# Patient Record
Sex: Female | Born: 1975 | Race: White | Hispanic: Yes | Marital: Married | Smoking: Never smoker
Health system: Southern US, Community
[De-identification: ages and names within clinical notes are randomized; demographics above are authoritative.]

## PROBLEM LIST (undated history)

## (undated) DIAGNOSIS — J309 Allergic rhinitis, unspecified: Secondary | ICD-10-CM

## (undated) DIAGNOSIS — E66811 Obesity, class 1: Secondary | ICD-10-CM

## (undated) DIAGNOSIS — J45909 Unspecified asthma, uncomplicated: Secondary | ICD-10-CM

## (undated) DIAGNOSIS — E669 Obesity, unspecified: Secondary | ICD-10-CM

## (undated) HISTORY — DX: Allergic rhinitis, unspecified: J30.9

## (undated) HISTORY — DX: Unspecified asthma, uncomplicated: J45.909

## (undated) HISTORY — DX: Obesity, unspecified: E66.9

## (undated) HISTORY — DX: Obesity, class 1: E66.811

---

## 2000-08-07 ENCOUNTER — Ambulatory Visit (HOSPITAL_COMMUNITY): Admission: RE | Admit: 2000-08-07 | Discharge: 2000-08-07 | Payer: Self-pay | Admitting: *Deleted

## 2000-12-24 ENCOUNTER — Inpatient Hospital Stay (HOSPITAL_COMMUNITY): Admission: AD | Admit: 2000-12-24 | Discharge: 2000-12-26 | Payer: Self-pay | Admitting: *Deleted

## 2000-12-27 ENCOUNTER — Encounter: Admission: RE | Admit: 2000-12-27 | Discharge: 2001-01-26 | Payer: Self-pay | Admitting: Obstetrics & Gynecology

## 2001-09-17 ENCOUNTER — Inpatient Hospital Stay (HOSPITAL_COMMUNITY): Admission: AD | Admit: 2001-09-17 | Discharge: 2001-09-17 | Payer: Self-pay | Admitting: *Deleted

## 2001-09-20 ENCOUNTER — Encounter: Admission: RE | Admit: 2001-09-20 | Discharge: 2001-09-20 | Payer: Self-pay | Admitting: Family Medicine

## 2001-09-26 ENCOUNTER — Encounter (INDEPENDENT_AMBULATORY_CARE_PROVIDER_SITE_OTHER): Payer: Self-pay | Admitting: *Deleted

## 2001-09-26 ENCOUNTER — Encounter: Admission: RE | Admit: 2001-09-26 | Discharge: 2001-09-26 | Payer: Self-pay | Admitting: Family Medicine

## 2001-11-07 ENCOUNTER — Encounter: Admission: RE | Admit: 2001-11-07 | Discharge: 2001-11-07 | Payer: Self-pay | Admitting: Family Medicine

## 2001-11-16 ENCOUNTER — Encounter: Admission: RE | Admit: 2001-11-16 | Discharge: 2001-11-16 | Payer: Self-pay | Admitting: Family Medicine

## 2001-12-10 ENCOUNTER — Encounter: Admission: RE | Admit: 2001-12-10 | Discharge: 2001-12-10 | Payer: Self-pay | Admitting: Family Medicine

## 2002-01-02 ENCOUNTER — Encounter: Admission: RE | Admit: 2002-01-02 | Discharge: 2002-01-02 | Payer: Self-pay | Admitting: Family Medicine

## 2002-01-09 ENCOUNTER — Encounter: Admission: RE | Admit: 2002-01-09 | Discharge: 2002-01-09 | Payer: Self-pay | Admitting: Family Medicine

## 2002-01-28 ENCOUNTER — Encounter: Admission: RE | Admit: 2002-01-28 | Discharge: 2002-01-28 | Payer: Self-pay | Admitting: Family Medicine

## 2002-02-04 ENCOUNTER — Encounter: Admission: RE | Admit: 2002-02-04 | Discharge: 2002-02-04 | Payer: Self-pay | Admitting: Family Medicine

## 2002-02-12 ENCOUNTER — Encounter: Admission: RE | Admit: 2002-02-12 | Discharge: 2002-02-12 | Payer: Self-pay | Admitting: Sports Medicine

## 2002-02-20 ENCOUNTER — Encounter: Admission: RE | Admit: 2002-02-20 | Discharge: 2002-02-20 | Payer: Self-pay | Admitting: Family Medicine

## 2002-02-25 ENCOUNTER — Encounter (HOSPITAL_COMMUNITY): Admission: RE | Admit: 2002-02-25 | Discharge: 2002-02-25 | Payer: Self-pay | Admitting: *Deleted

## 2002-02-26 ENCOUNTER — Inpatient Hospital Stay (HOSPITAL_COMMUNITY): Admission: AD | Admit: 2002-02-26 | Discharge: 2002-02-28 | Payer: Self-pay | Admitting: *Deleted

## 2002-04-16 ENCOUNTER — Encounter: Admission: RE | Admit: 2002-04-16 | Discharge: 2002-04-16 | Payer: Self-pay | Admitting: Sports Medicine

## 2002-04-16 ENCOUNTER — Encounter (INDEPENDENT_AMBULATORY_CARE_PROVIDER_SITE_OTHER): Payer: Self-pay | Admitting: *Deleted

## 2003-01-21 ENCOUNTER — Encounter: Admission: RE | Admit: 2003-01-21 | Discharge: 2003-01-21 | Payer: Self-pay | Admitting: Sports Medicine

## 2003-05-09 ENCOUNTER — Encounter: Admission: RE | Admit: 2003-05-09 | Discharge: 2003-05-09 | Payer: Self-pay | Admitting: Family Medicine

## 2003-05-19 ENCOUNTER — Encounter: Admission: RE | Admit: 2003-05-19 | Discharge: 2003-05-19 | Payer: Self-pay | Admitting: Family Medicine

## 2003-07-27 ENCOUNTER — Encounter (INDEPENDENT_AMBULATORY_CARE_PROVIDER_SITE_OTHER): Payer: Self-pay | Admitting: *Deleted

## 2003-08-22 ENCOUNTER — Encounter: Admission: RE | Admit: 2003-08-22 | Discharge: 2003-08-22 | Payer: Self-pay | Admitting: Family Medicine

## 2003-08-29 ENCOUNTER — Encounter: Admission: RE | Admit: 2003-08-29 | Discharge: 2003-08-29 | Payer: Self-pay | Admitting: Sports Medicine

## 2003-09-11 ENCOUNTER — Encounter: Admission: RE | Admit: 2003-09-11 | Discharge: 2003-09-11 | Payer: Self-pay | Admitting: Family Medicine

## 2003-12-12 ENCOUNTER — Emergency Department (HOSPITAL_COMMUNITY): Admission: EM | Admit: 2003-12-12 | Discharge: 2003-12-12 | Payer: Self-pay | Admitting: Family Medicine

## 2004-02-05 ENCOUNTER — Ambulatory Visit: Payer: Self-pay | Admitting: Sports Medicine

## 2004-02-23 ENCOUNTER — Ambulatory Visit: Payer: Self-pay | Admitting: Family Medicine

## 2004-03-16 ENCOUNTER — Ambulatory Visit: Payer: Self-pay

## 2004-04-13 ENCOUNTER — Ambulatory Visit: Payer: Self-pay | Admitting: Family Medicine

## 2004-05-07 ENCOUNTER — Ambulatory Visit: Payer: Self-pay | Admitting: Family Medicine

## 2004-05-12 ENCOUNTER — Ambulatory Visit: Payer: Self-pay | Admitting: Family Medicine

## 2004-08-13 ENCOUNTER — Ambulatory Visit: Payer: Self-pay

## 2004-08-25 ENCOUNTER — Ambulatory Visit (HOSPITAL_COMMUNITY): Admission: RE | Admit: 2004-08-25 | Discharge: 2004-08-25 | Payer: Self-pay | Admitting: Sports Medicine

## 2004-09-24 ENCOUNTER — Ambulatory Visit: Payer: Self-pay | Admitting: Sports Medicine

## 2004-12-09 ENCOUNTER — Ambulatory Visit: Payer: Self-pay | Admitting: Family Medicine

## 2005-01-21 ENCOUNTER — Ambulatory Visit: Payer: Self-pay

## 2005-06-14 ENCOUNTER — Ambulatory Visit: Payer: Self-pay | Admitting: Family Medicine

## 2006-04-03 ENCOUNTER — Ambulatory Visit: Payer: Self-pay | Admitting: Internal Medicine

## 2006-05-18 ENCOUNTER — Encounter (INDEPENDENT_AMBULATORY_CARE_PROVIDER_SITE_OTHER): Payer: Self-pay | Admitting: Internal Medicine

## 2006-05-18 ENCOUNTER — Encounter (INDEPENDENT_AMBULATORY_CARE_PROVIDER_SITE_OTHER): Payer: Self-pay | Admitting: Nurse Practitioner

## 2006-05-18 ENCOUNTER — Ambulatory Visit: Payer: Self-pay | Admitting: Internal Medicine

## 2006-05-18 LAB — CONVERTED CEMR LAB
Ketones, ur: NEGATIVE mg/dL
Leukocytes, UA: NEGATIVE
Pap Smear: NORMAL
Protein, ur: NEGATIVE mg/dL
Urobilinogen, UA: 0.2
pH: 5

## 2006-05-19 ENCOUNTER — Encounter (INDEPENDENT_AMBULATORY_CARE_PROVIDER_SITE_OTHER): Payer: Self-pay | Admitting: Nurse Practitioner

## 2006-05-19 LAB — CONVERTED CEMR LAB
ALT: 27 units/L
AST: 20 units/L
CO2: 19 meq/L
Chlamydia, DNA Probe: NEGATIVE
Creatinine, Ser: 0.69 mg/dL
GC Probe Amp, Genital: NEGATIVE
HCT: 40.5 %
Hemoglobin: 13.4 g/dL
LDL Cholesterol: 70 mg/dL
Platelets: 320 10*3/uL
Potassium: 3.9 meq/L
RDW: 13 %
Total Bilirubin: 0.5 mg/dL
Total CHOL/HDL Ratio: 2.5
Total Protein: 7.7 g/dL
WBC: 7.5 10*3/uL

## 2006-05-23 ENCOUNTER — Ambulatory Visit (HOSPITAL_COMMUNITY): Admission: RE | Admit: 2006-05-23 | Discharge: 2006-05-23 | Payer: Self-pay | Admitting: Internal Medicine

## 2006-05-25 DIAGNOSIS — J309 Allergic rhinitis, unspecified: Secondary | ICD-10-CM | POA: Insufficient documentation

## 2006-05-25 DIAGNOSIS — J45909 Unspecified asthma, uncomplicated: Secondary | ICD-10-CM | POA: Insufficient documentation

## 2006-05-26 ENCOUNTER — Encounter (INDEPENDENT_AMBULATORY_CARE_PROVIDER_SITE_OTHER): Payer: Self-pay | Admitting: *Deleted

## 2006-06-16 ENCOUNTER — Ambulatory Visit: Payer: Self-pay | Admitting: Internal Medicine

## 2006-12-01 ENCOUNTER — Telehealth (INDEPENDENT_AMBULATORY_CARE_PROVIDER_SITE_OTHER): Payer: Self-pay | Admitting: Internal Medicine

## 2006-12-08 ENCOUNTER — Ambulatory Visit: Payer: Self-pay | Admitting: Internal Medicine

## 2006-12-08 ENCOUNTER — Encounter (INDEPENDENT_AMBULATORY_CARE_PROVIDER_SITE_OTHER): Payer: Self-pay | Admitting: Nurse Practitioner

## 2006-12-08 DIAGNOSIS — I872 Venous insufficiency (chronic) (peripheral): Secondary | ICD-10-CM | POA: Insufficient documentation

## 2006-12-13 ENCOUNTER — Encounter (INDEPENDENT_AMBULATORY_CARE_PROVIDER_SITE_OTHER): Payer: Self-pay | Admitting: *Deleted

## 2007-01-24 ENCOUNTER — Ambulatory Visit: Payer: Self-pay | Admitting: Internal Medicine

## 2007-04-11 ENCOUNTER — Ambulatory Visit: Payer: Self-pay | Admitting: Internal Medicine

## 2007-05-08 ENCOUNTER — Encounter (INDEPENDENT_AMBULATORY_CARE_PROVIDER_SITE_OTHER): Payer: Self-pay | Admitting: Family Medicine

## 2007-05-18 ENCOUNTER — Ambulatory Visit: Payer: Self-pay | Admitting: Family Medicine

## 2007-05-18 ENCOUNTER — Encounter (INDEPENDENT_AMBULATORY_CARE_PROVIDER_SITE_OTHER): Payer: Self-pay | Admitting: Family Medicine

## 2007-05-18 LAB — CONVERTED CEMR LAB
Basophils Relative: 0 % (ref 0–1)
Hepatitis B Surface Ag: NEGATIVE
Lymphs Abs: 2.1 10*3/uL (ref 0.7–4.0)
MCV: 89.3 fL (ref 78.0–100.0)
Monocytes Absolute: 0.4 10*3/uL (ref 0.1–1.0)
Neutro Abs: 6 10*3/uL (ref 1.7–7.7)
Platelets: 320 10*3/uL (ref 150–400)
Rubella: >500 intl units/mL — ABNORMAL HIGH
WBC: 8.6 10*3/uL (ref 4.0–10.5)

## 2007-06-01 ENCOUNTER — Encounter (INDEPENDENT_AMBULATORY_CARE_PROVIDER_SITE_OTHER): Payer: Self-pay | Admitting: Family Medicine

## 2007-06-01 ENCOUNTER — Encounter: Payer: Self-pay | Admitting: Family Medicine

## 2007-06-01 ENCOUNTER — Ambulatory Visit: Payer: Self-pay | Admitting: Family Medicine

## 2007-06-01 LAB — CONVERTED CEMR LAB
Chlamydia, Swab/Urine, PCR: NEGATIVE
GC Probe Amp, Urine: NEGATIVE
Glucose, Urine, Semiquant: NEGATIVE
Whiff Test: NEGATIVE

## 2007-06-04 ENCOUNTER — Encounter (INDEPENDENT_AMBULATORY_CARE_PROVIDER_SITE_OTHER): Payer: Self-pay | Admitting: *Deleted

## 2007-06-11 ENCOUNTER — Ambulatory Visit: Payer: Self-pay | Admitting: Family Medicine

## 2007-06-11 LAB — CONVERTED CEMR LAB: GTT, 1 hr: 140 mg/dL

## 2007-06-15 ENCOUNTER — Ambulatory Visit: Payer: Self-pay | Admitting: Family Medicine

## 2007-06-15 ENCOUNTER — Encounter (INDEPENDENT_AMBULATORY_CARE_PROVIDER_SITE_OTHER): Payer: Self-pay | Admitting: Family Medicine

## 2007-06-25 ENCOUNTER — Encounter (INDEPENDENT_AMBULATORY_CARE_PROVIDER_SITE_OTHER): Payer: Self-pay | Admitting: Family Medicine

## 2007-07-20 ENCOUNTER — Ambulatory Visit: Payer: Self-pay | Admitting: Family Medicine

## 2007-08-13 ENCOUNTER — Ambulatory Visit: Payer: Self-pay | Admitting: Family Medicine

## 2007-08-13 ENCOUNTER — Encounter (INDEPENDENT_AMBULATORY_CARE_PROVIDER_SITE_OTHER): Payer: Self-pay | Admitting: Family Medicine

## 2007-08-13 LAB — CONVERTED CEMR LAB: Glucose, Urine, Semiquant: NEGATIVE

## 2007-08-14 LAB — CONVERTED CEMR LAB
Chlamydia, DNA Probe: NEGATIVE
GC Probe Amp, Genital: NEGATIVE

## 2007-08-27 ENCOUNTER — Encounter (INDEPENDENT_AMBULATORY_CARE_PROVIDER_SITE_OTHER): Payer: Self-pay | Admitting: Family Medicine

## 2007-08-27 ENCOUNTER — Ambulatory Visit: Payer: Self-pay | Admitting: Family Medicine

## 2007-08-30 ENCOUNTER — Ambulatory Visit: Payer: Self-pay | Admitting: Family Medicine

## 2007-08-30 ENCOUNTER — Encounter (INDEPENDENT_AMBULATORY_CARE_PROVIDER_SITE_OTHER): Payer: Self-pay | Admitting: Family Medicine

## 2007-08-30 LAB — CONVERTED CEMR LAB
Glucose, Urine, Semiquant: NEGATIVE
Hemoglobin: 10.8 g/dL — ABNORMAL LOW (ref 12.0–15.0)
Platelets: 359 10*3/uL (ref 150–400)
RBC: 3.82 M/uL — ABNORMAL LOW (ref 3.87–5.11)
RDW: 13.1 % (ref 11.5–15.5)

## 2007-09-13 ENCOUNTER — Ambulatory Visit: Payer: Self-pay | Admitting: Family Medicine

## 2007-09-13 ENCOUNTER — Encounter: Payer: Self-pay | Admitting: Family Medicine

## 2007-09-13 DIAGNOSIS — K219 Gastro-esophageal reflux disease without esophagitis: Secondary | ICD-10-CM | POA: Insufficient documentation

## 2007-10-03 ENCOUNTER — Ambulatory Visit: Payer: Self-pay | Admitting: Family Medicine

## 2007-10-19 ENCOUNTER — Ambulatory Visit: Payer: Self-pay | Admitting: Family Medicine

## 2007-10-19 ENCOUNTER — Encounter: Payer: Self-pay | Admitting: Family Medicine

## 2007-10-19 DIAGNOSIS — N898 Other specified noninflammatory disorders of vagina: Secondary | ICD-10-CM | POA: Insufficient documentation

## 2007-10-19 LAB — CONVERTED CEMR LAB
GC Probe Amp, Genital: NEGATIVE
Whiff Test: NEGATIVE

## 2007-10-25 ENCOUNTER — Encounter: Payer: Self-pay | Admitting: Family Medicine

## 2007-10-25 ENCOUNTER — Ambulatory Visit: Payer: Self-pay | Admitting: Family Medicine

## 2007-10-25 DIAGNOSIS — L299 Pruritus, unspecified: Secondary | ICD-10-CM | POA: Insufficient documentation

## 2007-10-25 DIAGNOSIS — Z22330 Carrier of Group B streptococcus: Secondary | ICD-10-CM

## 2007-10-25 LAB — CONVERTED CEMR LAB
Glucose, Urine, Semiquant: NEGATIVE
Protein, U semiquant: NEGATIVE

## 2007-10-26 LAB — CONVERTED CEMR LAB
ALT: 30 units/L (ref 0–35)
AST: 39 units/L — ABNORMAL HIGH (ref 0–37)
Alkaline Phosphatase: 201 units/L — ABNORMAL HIGH (ref 39–117)
Indirect Bilirubin: 0.4 mg/dL (ref 0.0–0.9)
Total Bilirubin: 0.5 mg/dL (ref 0.3–1.2)

## 2007-10-30 ENCOUNTER — Inpatient Hospital Stay (HOSPITAL_COMMUNITY): Admission: AD | Admit: 2007-10-30 | Discharge: 2007-11-01 | Payer: Self-pay | Admitting: Obstetrics & Gynecology

## 2007-10-30 ENCOUNTER — Ambulatory Visit: Payer: Self-pay | Admitting: Obstetrics & Gynecology

## 2007-11-24 ENCOUNTER — Telehealth (INDEPENDENT_AMBULATORY_CARE_PROVIDER_SITE_OTHER): Payer: Self-pay | Admitting: Family Medicine

## 2007-12-10 ENCOUNTER — Encounter (INDEPENDENT_AMBULATORY_CARE_PROVIDER_SITE_OTHER): Payer: Self-pay | Admitting: Family Medicine

## 2007-12-10 ENCOUNTER — Ambulatory Visit: Payer: Self-pay | Admitting: Family Medicine

## 2007-12-10 DIAGNOSIS — N760 Acute vaginitis: Secondary | ICD-10-CM | POA: Insufficient documentation

## 2007-12-10 DIAGNOSIS — R209 Unspecified disturbances of skin sensation: Secondary | ICD-10-CM

## 2007-12-12 LAB — CONVERTED CEMR LAB: GC Probe Amp, Genital: NEGATIVE

## 2008-01-23 ENCOUNTER — Telehealth (INDEPENDENT_AMBULATORY_CARE_PROVIDER_SITE_OTHER): Payer: Self-pay | Admitting: Internal Medicine

## 2008-02-15 ENCOUNTER — Encounter (INDEPENDENT_AMBULATORY_CARE_PROVIDER_SITE_OTHER): Payer: Self-pay | Admitting: Family Medicine

## 2008-02-15 ENCOUNTER — Ambulatory Visit: Payer: Self-pay | Admitting: Family Medicine

## 2008-02-26 ENCOUNTER — Ambulatory Visit: Payer: Self-pay | Admitting: Family Medicine

## 2008-02-26 DIAGNOSIS — D649 Anemia, unspecified: Secondary | ICD-10-CM | POA: Insufficient documentation

## 2008-02-26 HISTORY — DX: Anemia, unspecified: D64.9

## 2008-08-14 ENCOUNTER — Ambulatory Visit: Payer: Self-pay | Admitting: Internal Medicine

## 2008-08-26 LAB — CONVERTED CEMR LAB
Eosinophils Relative: 8 % — ABNORMAL HIGH (ref 0–5)
Hemoglobin: 13.8 g/dL (ref 12.0–15.0)
Monocytes Relative: 5 % (ref 3–12)
Neutro Abs: 3.6 10*3/uL (ref 1.7–7.7)
Neutrophils Relative %: 56 % (ref 43–77)
RBC: 4.48 M/uL (ref 3.87–5.11)
RDW: 13.4 % (ref 11.5–15.5)
WBC: 6.4 10*3/uL (ref 4.0–10.5)

## 2008-12-04 ENCOUNTER — Ambulatory Visit: Payer: Self-pay | Admitting: Internal Medicine

## 2008-12-04 DIAGNOSIS — R109 Unspecified abdominal pain: Secondary | ICD-10-CM

## 2008-12-04 DIAGNOSIS — L659 Nonscarring hair loss, unspecified: Secondary | ICD-10-CM | POA: Insufficient documentation

## 2008-12-04 LAB — CONVERTED CEMR LAB
Bilirubin Urine: NEGATIVE
Glucose, Urine, Semiquant: NEGATIVE

## 2008-12-05 ENCOUNTER — Encounter (INDEPENDENT_AMBULATORY_CARE_PROVIDER_SITE_OTHER): Payer: Self-pay | Admitting: Internal Medicine

## 2008-12-22 ENCOUNTER — Encounter (INDEPENDENT_AMBULATORY_CARE_PROVIDER_SITE_OTHER): Payer: Self-pay | Admitting: Internal Medicine

## 2009-01-01 ENCOUNTER — Ambulatory Visit (HOSPITAL_COMMUNITY): Admission: RE | Admit: 2009-01-01 | Discharge: 2009-01-01 | Payer: Self-pay | Admitting: Internal Medicine

## 2009-02-06 ENCOUNTER — Ambulatory Visit: Payer: Self-pay | Admitting: Internal Medicine

## 2009-02-06 DIAGNOSIS — N83209 Unspecified ovarian cyst, unspecified side: Secondary | ICD-10-CM

## 2009-03-02 ENCOUNTER — Ambulatory Visit (HOSPITAL_COMMUNITY): Admission: RE | Admit: 2009-03-02 | Discharge: 2009-03-02 | Payer: Self-pay | Admitting: Internal Medicine

## 2009-03-16 ENCOUNTER — Ambulatory Visit: Payer: Self-pay | Admitting: Internal Medicine

## 2009-04-16 ENCOUNTER — Ambulatory Visit: Payer: Self-pay | Admitting: Internal Medicine

## 2009-04-16 ENCOUNTER — Other Ambulatory Visit: Admission: RE | Admit: 2009-04-16 | Discharge: 2009-04-16 | Payer: Self-pay | Admitting: Internal Medicine

## 2009-04-16 DIAGNOSIS — L301 Dyshidrosis [pompholyx]: Secondary | ICD-10-CM

## 2009-04-16 DIAGNOSIS — Z8639 Personal history of other endocrine, nutritional and metabolic disease: Secondary | ICD-10-CM

## 2009-04-16 DIAGNOSIS — Z862 Personal history of diseases of the blood and blood-forming organs and certain disorders involving the immune mechanism: Secondary | ICD-10-CM

## 2009-04-16 HISTORY — DX: Personal history of diseases of the blood and blood-forming organs and certain disorders involving the immune mechanism: Z86.2

## 2009-04-16 HISTORY — DX: Personal history of diseases of the blood and blood-forming organs and certain disorders involving the immune mechanism: Z86.39

## 2009-04-16 LAB — CONVERTED CEMR LAB
Bilirubin Urine: NEGATIVE
Glucose, Urine, Semiquant: NEGATIVE
KOH Prep: NEGATIVE
Ketones, urine, test strip: NEGATIVE
Nitrite: NEGATIVE
Pap Smear: NEGATIVE
Protein, U semiquant: NEGATIVE
Specific Gravity, Urine: <1.005
Urobilinogen, UA: 0.2
WBC Urine, dipstick: NEGATIVE
Whiff Test: NEGATIVE
pH: 5

## 2009-04-30 DIAGNOSIS — R7309 Other abnormal glucose: Secondary | ICD-10-CM

## 2009-04-30 LAB — CONVERTED CEMR LAB
Albumin: 5.1 g/dL (ref 3.5–5.2)
CO2: 24 meq/L (ref 19–32)
Chlamydia, DNA Probe: NEGATIVE
Glucose, Bld: 104 mg/dL — ABNORMAL HIGH (ref 70–99)
Potassium: 4.4 meq/L (ref 3.5–5.3)
Sodium: 139 meq/L (ref 135–145)
Total Bilirubin: 0.3 mg/dL (ref 0.3–1.2)
Total Protein: 7.9 g/dL (ref 6.0–8.3)

## 2009-05-04 ENCOUNTER — Ambulatory Visit: Payer: Self-pay | Admitting: Internal Medicine

## 2009-05-04 LAB — CONVERTED CEMR LAB: Hgb A1c MFr Bld: 4.7 %

## 2009-05-12 ENCOUNTER — Emergency Department (HOSPITAL_COMMUNITY): Admission: EM | Admit: 2009-05-12 | Discharge: 2009-05-12 | Payer: Self-pay | Admitting: Emergency Medicine

## 2009-06-01 ENCOUNTER — Encounter (INDEPENDENT_AMBULATORY_CARE_PROVIDER_SITE_OTHER): Payer: Self-pay | Admitting: *Deleted

## 2010-01-07 ENCOUNTER — Ambulatory Visit: Payer: Self-pay | Admitting: Nurse Practitioner

## 2010-01-07 ENCOUNTER — Encounter (INDEPENDENT_AMBULATORY_CARE_PROVIDER_SITE_OTHER): Payer: Self-pay | Admitting: Internal Medicine

## 2010-01-07 DIAGNOSIS — K089 Disorder of teeth and supporting structures, unspecified: Secondary | ICD-10-CM | POA: Insufficient documentation

## 2010-01-19 ENCOUNTER — Encounter (INDEPENDENT_AMBULATORY_CARE_PROVIDER_SITE_OTHER): Payer: Self-pay | Admitting: Internal Medicine

## 2010-04-19 ENCOUNTER — Encounter: Payer: Self-pay | Admitting: Internal Medicine

## 2010-04-27 NOTE — Assessment & Plan Note (Signed)
Summary: referral to the dentist////mc   Vital Signs:  Patient profile:   35 year old female Menstrual status:  irregular Weight:      146.2 pounds Pulse rate:   76 / minute Resp:     18 per minute BP sitting:   124 / 86  (left arm) Cuff size:   regular  Vitals Entered By: Michelle Nasuti (January 07, 2010 3:19 PM) CC: ptrequest dental referral Pain Assessment Patient in pain? yes      Intensity: 2   Primary Care Provider:  Alanda Amass MD  CC:  ptrequest dental referral.  History of Present Illness: Pt. had crown placed on tooth when she was 35 years old.  The crown has fallen off and she needs it replaced.  Allergies: No Known Drug Allergies  Physical Exam  Mouth:  Very large posterior cavity --right maxillary molar.  No surrounding erythema or fluctuance.   Impression & Recommendations:  Problem # 1:  DENTAL PAIN (ICD-525.9)  Appears pt. has lost a very large filling. Will try to refer to dental clinic, but gave her info on the free dental clinic  Orders: Dental Referral (Dentist)  Problem # 2:  Preventive Health Care (ICD-V70.0) flu vaccine.  Complete Medication List: 1)  Advair Diskus 250-50 Mcg/dose Misc (Fluticasone-salmeterol) .Marland Kitchen.. 1 inhalation two times a day 2)  Proventil Hfa 108 (90 Base) Mcg/act Aers (Albuterol sulfate) .... 2 puffs every 4 hours as needed for shortness of breath 3)  Calna Tabs (Prenatal vitamins) .... Take one tablet by mouth daily. 4)  Fluticasone Propionate 50 Mcg/act Susp (Fluticasone propionate) .... 2 sprays each nostril daily 5)  Fexofenadine Hcl 180 Mg Tabs (Fexofenadine hcl) .Marland Kitchen.. 1 tab by mouth daily 6)  Clobetasol Propionate 0.05 % Crea (Clobetasol propionate) .... Apply to hands and affected areas two times a day prn  Other Orders: Influenza Vaccine NON MCR ([address])  Patient Instructions: 1)  Dental clinic referral.   Influenza Vaccine    Vaccine Type: Fluvax Non-MCR    Site: right deltoid    Mfr:  GlaxoSmithKline    Dose: 0.5 ml    Route: IM    Given by: Michelle Nasuti    Exp. Date: 09/25/2010    Lot #: [id number]    VIS given: 10/20/09 version given January 07, 2010.  Flu Vaccine Consent Questions    Do you have a history of severe allergic reactions to this vaccine? no    Any prior history of allergic reactions to egg and/or gelatin? no    Do you have a sensitivity to the preservative Thimersol? no    Do you have a past history of Guillan-Barre Syndrome? no    Do you currently have an acute febrile illness? no    Have you ever had a severe reaction to latex? no    Vaccine information given and explained to patient? yes    Are you currently pregnant? no

## 2010-04-27 NOTE — Letter (Signed)
Summary: TEST ORDER FORM/ULTRASOUND//[facility]  TEST ORDER FORM/ULTRASOUND//[facility]   Imported By: Arta Bruce 04/17/2009 11:02:11  _____________________________________________________________________  External Attachment:    Type:   Image     Comment:   External Document

## 2010-04-27 NOTE — Letter (Signed)
Summary: DENTAL REFERRAL  DENTAL REFERRAL   Imported By: Arta Bruce 01/21/2010 15:50:02  _____________________________________________________________________  External Attachment:    Type:   Image     Comment:   External Document

## 2010-04-27 NOTE — Letter (Signed)
Summary: Generic Letter  [facility]  [address] [place], [place] [address]   Phone: [phone]  Fax: [phone]    01/07/2010     RE:  DULCEMARIA BULA      [address]      [place], [place]  [address]  To Whom It May Concern:  Ms. Domenick Bookbinder is a patient at our clinic who desperately needs some dental attention.  There is a free Clinic on Friday, November 11 and Saturday, November 12.  As her health care provider, I am asking that she be allowed to go to the clinic on one of these days to receive the care she needs.  Please do not hesitate to call if you need more information.  Thanks so much for your attention to this matter.        Sincerely,   Julieanne Manson MD

## 2010-04-27 NOTE — Letter (Signed)
Summary: *HSN Results Follow up  [facility]  [address] [place], [place] [address]   Phone: [phone]  Fax: [phone]      06/01/2009   [facility] CARMEN MOSQUEDA [address] [place], [place]  [address]   Dear  Ms. Andromeda MOSQUEDA,                            ____S.Drinkard,FNP   ____D. Gore,FNP       ____B. McPherson,MD   ____V. Rankins,MD    __xx__E. Mulberry,MD    ____N. Daphine Deutscher, FNP  ____D. Reche Dixon, MD    ____K. Philipp Deputy, MD    ____Other     This letter is to inform you that your recent test(s):  _______Pap Smear    ___xx____Lab Test     _______X-ray    ___xx____ is within acceptable limits  _______ requires a medication change  _______ requires a follow-up lab visit  _______ requires a follow-up visit with your provider   Comments:  Su nivel de azucar fue normal, usted no tiene diabetes.       _________________________________________________________ If you have any questions, please contact our office                     Sincerely,  Vesta Mixer CMA [facility]

## 2010-04-27 NOTE — Assessment & Plan Note (Signed)
Summary: CPP EXAM///GK   Vital Signs:  Patient profile:   35 year old female Menstrual status:  irregular LMP:     03/11/2009 Height:      60 inches Weight:      140.9 pounds BMI:     27.62 Temp:     97.8 degrees F oral Pulse rate:   80 / minute Pulse rhythm:   regular Resp:     17 per minute BP sitting:   113 / 70  (left arm) Cuff size:   regular  Vitals Entered By: Geanie Cooley  (April 16, 2009 3:48 PM) CC:  pt here for CPP and wants to know results of ultrasound. Pt states she feels like there is a ballon inside her vagina, its a big bump she feels in the inside. Pt states she has had the problem since her daughter was born ago but it feels it has dropped lower in her vagina. Pt states she has a lot of abdominal pain, and trouble with her ovaries. Pt states that when she sits for long periods of times, thats when she has the most pain in the ovaries Pain Assessment Patient in pain? yes     Location: abdomen Intensity: 5 Type: aching  Does patient need assistance? Functional Status Self care Ambulation Normal LMP (date): 03/11/2009 LMP - Character: normal    Menses interval (days): 28 Menstrual flow (days): 3 On BCP's at conception: no Enter LMP: 03/11/2009 Last PAP Result Normal   Primary Care Provider:  Alanda Amass MD  CC:   pt here for CPP and wants to know results of ultrasound. Pt states she feels like there is a ballon inside her vagina, its a big bump she feels in the inside. Pt states she has had the problem since her daughter was born ago but it feels it has dropped lower in her vagina. Pt states she has a lot of abdominal pain, and trouble with her ovaries. Pt states that when she sits for long periods of times, and thats when she has the most pain in the ovaries.  History of Present Illness: 35 yo female here for CPP.  Concerns:  1.  Pelvic discomfort:  as above.  Discussed, however, that the previous right ovarian cyst is gone.  IUD  in in place.  Pt. feels like there is something in her vaginal canal that shouldn't be there.  Notes with this that her urine flow is decreased.  No urinary incontinence.  Again, noted this first after last child born.  Habits & Providers  Alcohol-Tobacco-Diet     Alcohol drinks/day: 0     Tobacco Status: never  Exercise-Depression-Behavior     Drug Use: never  Allergies (verified): No Known Drug Allergies  Past History:  Past Medical History: Reviewed history from 06/01/2007 and no changes required. RHINITIS, ALLERGIC (ICD-477.9) ASTHMA, UNSPECIFIED (ICD-493.90) Peak Flow-400-420 baseline - 02/23/2004  Past Surgical History: Reviewed history from 06/01/2007 and no changes required. None  Family History: Mother, 56:  DM, hypertension.  Lives in [place] Father, 32:  Hypertension 3 Siblings: Sister with hypertension. 4 children:  12, 8, 7, 18 mos:  all healthy  Social History: Married and lives at home  with her husband and 4 children. 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 Eyes:  Denies blurring. ENT:  Denies decreased hearing. CV:  Denies chest pain or discomfort and palpitations. Resp:  Asthma fairly well  controlled Uses rescue inhaler maybe every 2 weeks.Marland Kitchen GI:  Complains of abdominal pain; denies bloody stools, dark tarry stools, and diarrhea; See HPI Does get BRBPR--only on tissue and when constipated--infrequent.. GU:  Denies discharge; see HPE with urination.. MS:  Denies joint pain, joint redness, and joint swelling. Derm:  Denies rash; red mark in glabellar area--comes and goes.  Currently has had for 2 weeks and has not resolved.  Starts out with dryness, thicker skin there.  . Neuro:  Denies numbness, tingling, and weakness. Psych:  Denies anxiety, depression, and suicidal thoughts/plans.  Physical Exam  General:  overweight, NAD Head:  Normocephalic and atraumatic without obvious  abnormalities. No apparent alopecia or balding. Eyes:  No corneal or conjunctival inflammation noted. EOMI. Perrla. Funduscopic exam benign, without hemorrhages, exudates or papilledema. Vision grossly normal. Ears:  External ear exam shows no significant lesions or deformities.  Otoscopic examination reveals clear canals, tympanic membranes are intact bilaterally without bulging, retraction, inflammation or discharge. Hearing is grossly normal bilaterally. Nose:  External nasal examination shows no deformity or inflammation. Nasal mucosa are pink and moist without lesions or exudates. Mouth:  Oral mucosa and oropharynx without lesions or exudates.  Teeth in good repair. Neck:  No deformities, masses, or tenderness noted. Breasts:  No mass, nodules, thickening, tenderness, bulging, retraction, inflamation, nipple discharge or skin changes noted.   Lungs:  Normal respiratory effort, chest expands symmetrically. Lungs are clear to auscultation, no crackles or wheezes. Heart:  Normal rate and regular rhythm. S1 and S2 normal without gallop, murmur, click, rub or other extra sounds. Abdomen:  Bowel sounds positive,abdomen soft and non-tender without masses, organomegaly or hernias noted. Genitalia:  Pelvic Exam:        External: normal female genitalia without lesions or masses        Vagina: normal without lesions or masses        Cervix: normal without lesions or masses--a bit of inflammation about IUD strings exiting os.  Thin white discharge,        Adnexa: normal bimanual exam without masses or fullness        Uterus: normal by palpation        Pap smear: performed Msk:  No deformity or scoliosis noted of thoracic or lumbar spine.   Pulses:  R and L carotid,radial,femoral,dorsalis pedis and posterior tibial pulses are full and equal bilaterally Extremities:  No clubbing, cyanosis, edema, or deformity noted with normal full range of motion of all joints.   Neurologic:  No cranial nerve deficits  noted. Station and gait are normal. Plantar reflexes are down-going bilaterally. DTRs are symmetrical throughout. Sensory, motor and coordinative functions appear intact. Skin:  Flushed skin on cheeks. 1/2 cm reddened nonpalpable area in glabellar area Dry papules on flushed background on upper arms Hands chapped from MCPs to fingertips with  Cervical Nodes:  No lymphadenopathy noted Axillary Nodes:  No palpable lymphadenopathy Inguinal Nodes:  No significant adenopathy Psych:  Cognition and judgment appear intact. Alert and cooperative with normal attention span and concentration. No apparent delusions, illusions, hallucinations   Impression & Recommendations:  Problem # 1:  ROUTINE GYNECOLOGICAL EXAMINATION (ICD-V72.31)  Orders: Pap Smear, Thin Prep ( Collection of) (Z6109) KOH/ WET Mount [phone]) UA Dipstick w/o Micro (manual) ([address]) T- GC Chlamydia ([address]) T-HIV Antibody  (Reflex) ([id number]) T-Syphilis Test (RPR) ([address]-96[address])  Problem # 2:  DYSHIDROSIS (ICD-705.81) Clobetasol to severe areas two times a day prn  Complete Medication List: 1)  Advair Diskus 250-50 Mcg/dose Misc (  Fluticasone-salmeterol) .Marland Kitchen.. 1 inhalation two times a day 2)  Proventil Hfa 108 (90 Base) Mcg/act Aers (Albuterol sulfate) .... 2 puffs every 4 hours as needed for shortness of breath 3)  Calna Tabs (Prenatal vitamins) .... Take one tablet by mouth daily. 4)  Fluticasone Propionate 50 Mcg/act Susp (Fluticasone propionate) .... 2 sprays each nostril daily 5)  Fexofenadine Hcl 180 Mg Tabs (Fexofenadine hcl) .Marland Kitchen.. 1 tab by mouth daily 6)  Clobetasol Propionate 0.05 % Crea (Clobetasol propionate) .... Apply to hands and affected areas two times a day prn  Other Orders: T-Comprehensive Metabolic Panel ([id number])  Patient Instructions: 1)  calcium 500 mg with 200 International Units vitamin D two times a day  2)  CPP with Dr. Delrae Alfred in 1 year 3)  Follow up with Dr. Delrae Alfred in 6 months  --asthma Prescriptions: CLOBETASOL PROPIONATE 0.05 % CREA (CLOBETASOL PROPIONATE) apply to hands and affected areas two times a day prn  #60 g x 0   Entered and Authorized by:   Julieanne Manson MD   Signed by:   Julieanne Manson MD on 04/16/2009   Method used:   Faxed to ...       [facility] (retail)       [address] [place], [place]  [address]       Ph: [phone]       Fax: [phone]   RxID:   [id number] FEXOFENADINE HCL 180 MG TABS (FEXOFENADINE HCL) 1 tab by mouth daily  #30 x 11   Entered and Authorized by:   Julieanne Manson MD   Signed by:   Julieanne Manson MD on 04/16/2009   Method used:   Faxed to ...       [facility] - [facility] (retail)       [address] [place], [place]  [address]       Ph: [phone]       Fax: [phone]   RxID:   [id number] FLUTICASONE PROPIONATE 50 MCG/ACT SUSP (FLUTICASONE PROPIONATE) 2 sprays each nostril daily  #30 x 11   Entered and Authorized by:   Julieanne Manson MD   Signed by:   Julieanne Manson MD on 04/16/2009   Method used:   Faxed to ...       [facility] - [facility] (retail)       [address] [place], [place]  [address]       Ph: [phone]       Fax: [phone]   RxID:   [id number] PROVENTIL HFA 108 (90 BASE) MCG/ACT AERS (ALBUTEROL SULFATE) 2 puffs every 4 hours as needed for shortness of breath  #1 x 3   Entered and Authorized by:   Julieanne Manson MD   Signed by:   Julieanne Manson MD on 04/16/2009   Method used:   Faxed to ...       [facility] - [facility] (retail)       [address] [place], [place]  [address]       Ph: [phone]       Fax: [phone]   RxID:   [id number] ADVAIR DISKUS 250-50 MCG/DOSE  MISC (FLUTICASONE-SALMETEROL) 1 inhalation two times a day  #1 x 11   Entered and Authorized by:   Julieanne Manson MD   Signed by:  Julieanne Manson MD on 04/16/2009   Method used:   Faxed to ...       [facility] - [facility] (retail)       [address] [place], [place]  [address]       Ph: [phone]       Fax: [phone]   RxID:   [id number]    Vital Signs:  Patient profile:   35 year old female Menstrual status:  irregular LMP:     03/11/2009 Height:      60 inches Weight:      140.9 pounds BMI:     27.62 Temp:     97.8 degrees F oral Pulse rate:   80 / minute Pulse rhythm:   regular Resp:     17 per minute BP sitting:   113 / 70  (left arm) Cuff size:   regular  Vitals Entered By: Geanie Cooley  (April 16, 2009 3:48 PM)    Preventive Care Screening  Prior Values:    Pap Smear:  Normal (05/18/2006)    Last Tetanus Booster:  Historical (03/29/2006)    Last Flu Shot:  Fluvax 3+ (03/16/2009)    Last Pneumovax:  Done. (02/26/2004)     No history of Mammogram. SBE:  No changes--checks monthly Osteoprevention:  One serving of dairy daily.  Does not tolerate milk well--GI upset.  No real exercise.  Laboratory Results   Urine Tests  Date/Time Received: April 16, 2009 4:16 PM   Routine Urinalysis   Color: lt. yellow Appearance: Clear Glucose: negative   (Normal Range: Negative) Bilirubin: negative   (Normal Range: Negative) Ketone: negative   (Normal Range: Negative) Spec. Gravity: <1.005   (Normal Range: 1.003-1.035) Blood: trace-lysed   (Normal Range: Negative) pH: 5.0   (Normal Range: 5.0-8.0) Protein: negative   (Normal Range: Negative) Urobilinogen: 0.2   (Normal Range: 0-1) Nitrite: negative   (Normal Range: Negative) Leukocyte Esterace: negative   (Normal Range: Negative)      Wet Mount Source: vaginal WBC/hpf: 5-10 Bacteria/hpf: 1+ Clue cells/hpf: none  Negative whiff Yeast/hpf: none Wet Mount KOH: Negative Trichomonas/hpf: none    Laboratory Results   Urine Tests    Routine  Urinalysis   Color: lt. yellow Appearance: Clear Glucose: negative   (Normal Range: Negative) Bilirubin: negative   (Normal Range: Negative) Ketone: negative   (Normal Range: Negative) Spec. Gravity: <1.005   (Normal Range: 1.003-1.035) Blood: trace-lysed   (Normal Range: Negative) pH: 5.0   (Normal Range: 5.0-8.0) Protein: negative   (Normal Range: Negative) Urobilinogen: 0.2   (Normal Range: 0-1) Nitrite: negative   (Normal Range: Negative) Leukocyte Esterace: negative   (Normal Range: Negative)      Wet Mount/KOH  Negative whiff   Appended Document: CPP EXAM///GK  Laboratory Results  Date/Time Received: April 16, 2009 5:42 PM   Other Tests  Rapid HIV: negative

## 2010-09-14 ENCOUNTER — Other Ambulatory Visit (HOSPITAL_COMMUNITY): Payer: Self-pay | Admitting: Family Medicine

## 2010-09-14 ENCOUNTER — Other Ambulatory Visit: Payer: Self-pay | Admitting: Family Medicine

## 2010-09-14 DIAGNOSIS — R1032 Left lower quadrant pain: Secondary | ICD-10-CM

## 2010-09-14 DIAGNOSIS — N912 Amenorrhea, unspecified: Secondary | ICD-10-CM

## 2010-09-16 ENCOUNTER — Other Ambulatory Visit (HOSPITAL_COMMUNITY): Payer: Self-pay

## 2010-12-24 LAB — CBC
HCT: 36.2
HCT: 38.5
Hemoglobin: 12
Hemoglobin: 12.7
MCHC: 33.1
MCV: 87.5
MCV: 88.4
RBC: 4.09
RDW: 19.6 — ABNORMAL HIGH
WBC: 11.2 — ABNORMAL HIGH
WBC: 9.2

## 2011-12-05 ENCOUNTER — Ambulatory Visit (INDEPENDENT_AMBULATORY_CARE_PROVIDER_SITE_OTHER): Payer: Self-pay | Admitting: Family Medicine

## 2011-12-05 ENCOUNTER — Encounter: Payer: Self-pay | Admitting: Family Medicine

## 2011-12-05 VITALS — BP 124/78 | HR 84 | Temp 98.1°F | Ht 61.0 in | Wt 152.0 lb

## 2011-12-05 DIAGNOSIS — Z3009 Encounter for other general counseling and advice on contraception: Secondary | ICD-10-CM

## 2011-12-05 DIAGNOSIS — E663 Overweight: Secondary | ICD-10-CM

## 2011-12-05 DIAGNOSIS — J45909 Unspecified asthma, uncomplicated: Secondary | ICD-10-CM

## 2011-12-05 DIAGNOSIS — J309 Allergic rhinitis, unspecified: Secondary | ICD-10-CM | POA: Insufficient documentation

## 2011-12-05 DIAGNOSIS — J4540 Moderate persistent asthma, uncomplicated: Secondary | ICD-10-CM

## 2011-12-05 DIAGNOSIS — Z6825 Body mass index (BMI) 25.0-25.9, adult: Secondary | ICD-10-CM

## 2011-12-05 HISTORY — DX: Encounter for other general counseling and advice on contraception: Z30.09

## 2011-12-05 MED ORDER — FLUTICASONE-SALMETEROL 250-50 MCG/DOSE IN AEPB: 1.0000 | INHALATION_SPRAY | Freq: Two times a day (BID) | RESPIRATORY_TRACT | Status: DC

## 2011-12-05 MED ORDER — ALBUTEROL SULFATE HFA 108 (90 BASE) MCG/ACT IN AERS: 2.0000 | INHALATION_SPRAY | Freq: Four times a day (QID) | RESPIRATORY_TRACT | Status: DC | PRN

## 2011-12-05 MED ORDER — NORGESTIMATE-ETH ESTRADIOL 0.25-35 MG-MCG PO TABS: 1.0000 | ORAL_TABLET | Freq: Every day | ORAL | Status: DC

## 2011-12-05 NOTE — Assessment & Plan Note (Signed)
Well controlled, no wheezing on exam. Once weekly symptoms. Have refilled advair and albuterol.

## 2011-12-05 NOTE — Progress Notes (Signed)
Subjective:    Patient ID: Amber Nixon, female    DOB: 1975/08/06, 36 y.o.   MRN: [id number]  HPI New Patient appointment.   1. Birth control-patient only interested in OCP. HIstory of IUD with extreme cramping that patient endured for 3 years. LMP 12/03/11 and uses condoms regularly.   2. Asthma, well controlled-patient on adviar and albuterol. USes albuterol once a week or less. No nighttime awakenings.   3. Overweight-patient admits to having a hard time fixing healthy foods as family not very supportive. She is not currently exercising at all.   THe following were reviewed and updated in epic: Past Medical History  Diagnosis Date  . Allergic rhinitis   . Asthma     1998   Family History  Problem Relation Age of Onset  . Asthma      aunts/uncles  . Diabetes      mother  . Hyperlipidemia Father     father  . Hypertension Father     mother   Meds- Current Outpatient Prescriptions on File Prior to Visit  Medication Sig Dispense Refill  . albuterol (PROVENTIL HFA;VENTOLIN HFA) 108 (90 BASE) MCG/ACT inhaler Inhale 2 puffs into the lungs every 6 (six) hours as needed.      . Fluticasone-Salmeterol (ADVAIR) 250-50 MCG/DOSE AEPB Inhale 1 puff into the lungs every 12 (twelve) hours.      . norgestimate-ethinyl estradiol (ORTHO-CYCLEN,SPRINTEC,PREVIFEM) 0.25-35 MG-MCG tablet Take 1 tablet by mouth daily.       Allergies-none  History   Social History  . Marital Status: Married   Social History Main Topics  . Smoking status: Never Smoker   . Smokeless tobacco: Not on file  . Alcohol Use: No  . Drug Use: No  . Sexually Active: Yes    Birth Control/ Protection: Condom, Pill    Social History Narrative   Livew with Husband Amber Nixon who is establishing care here as well as 4 kids. Patient has a middle school education and speaks some english but requires Spanish interpreter. WOrks part time at [employer].     Review of Systems -See HPI  Past Medical  History-smoking status noted: never smoker. Reviewed problem list.  Medications- reviewed and updated Chief complaint-noted    Objective:   Physical Exam  Constitutional: She is oriented to person, place, and time. She appears well-developed and well-nourished. No distress.  HENT:  Head: Normocephalic and atraumatic.  Right Ear: External ear normal.  Left Ear: External ear normal.  Mouth/Throat: Oropharynx is clear and moist.  Eyes: Conjunctivae and EOM are normal. Pupils are equal, round, and reactive to light.  Neck: Normal range of motion. Neck supple. No thyromegaly present.  Cardiovascular: Normal rate and regular rhythm.  Exam reveals no gallop and no friction rub.   No murmur heard. Pulmonary/Chest: Effort normal and breath sounds normal. She has no wheezes. She has no rales.  Abdominal: Soft. Bowel sounds are normal. She exhibits no distension. There is no tenderness.  Musculoskeletal: Normal range of motion. She exhibits no edema.  Lymphadenopathy:    She has no cervical adenopathy.  Neurological: She is alert and oriented to person, place, and time.  Skin: Skin is warm and dry. She is not diaphoretic.  Psychiatric: She has a normal mood and affect. Her behavior is normal.  BP 124/78  Pulse 84  Temp 98.1 F (36.7 C) (Oral)  Ht 5\' 1"  (1.549 m)  Wt 152 lb (68.947 kg)  BMI 28.72 kg/m2  Assessment & Plan:

## 2011-12-05 NOTE — Assessment & Plan Note (Signed)
Counseled on exercise and increased fruits and vegetables. Plan to discuss with husband when he establishes care as well.

## 2011-12-05 NOTE — Patient Instructions (Signed)
I would like to see you at least every year. You set a goal of exercising twice a week by walking around your neighborhood.. I am going to refill your medicines.

## 2011-12-05 NOTE — Assessment & Plan Note (Signed)
Refilled SPrintec. LMP 12/03/11 and has been using condoms. Will defer pregnancy testing given recent LMP>

## 2012-01-19 ENCOUNTER — Ambulatory Visit (INDEPENDENT_AMBULATORY_CARE_PROVIDER_SITE_OTHER): Payer: Self-pay | Admitting: *Deleted

## 2012-01-19 DIAGNOSIS — Z23 Encounter for immunization: Secondary | ICD-10-CM

## 2012-12-26 ENCOUNTER — Telehealth: Payer: Self-pay | Admitting: Family Medicine

## 2012-12-26 DIAGNOSIS — Z3009 Encounter for other general counseling and advice on contraception: Secondary | ICD-10-CM

## 2012-12-26 MED ORDER — NORGESTIMATE-ETH ESTRADIOL 0.25-35 MG-MCG PO TABS: 1.0000 | ORAL_TABLET | Freq: Every day | ORAL | Status: DC

## 2012-12-26 NOTE — Telephone Encounter (Signed)
Refilled birth control

## 2012-12-31 ENCOUNTER — Telehealth: Payer: Self-pay | Admitting: Family Medicine

## 2012-12-31 DIAGNOSIS — Z3009 Encounter for other general counseling and advice on contraception: Secondary | ICD-10-CM

## 2012-12-31 NOTE — Telephone Encounter (Signed)
Will forward to MD. Chanteria Haggard,CMA  

## 2012-12-31 NOTE — Telephone Encounter (Signed)
Husband called for wife-she doesn't speak English Would like refill on birth control Pharmacy: [facility]

## 2013-01-01 MED ORDER — NORGESTIMATE-ETH ESTRADIOL 0.25-35 MG-MCG PO TABS: 1.0000 | ORAL_TABLET | Freq: Every day | ORAL | Status: DC

## 2013-01-01 NOTE — Telephone Encounter (Signed)
Refilled.  Please inform patient.

## 2019-05-27 ENCOUNTER — Other Ambulatory Visit: Payer: Self-pay

## 2019-05-27 DIAGNOSIS — Z1231 Encounter for screening mammogram for malignant neoplasm of breast: Secondary | ICD-10-CM

## 2019-06-25 ENCOUNTER — Other Ambulatory Visit: Payer: Self-pay

## 2019-06-25 ENCOUNTER — Ambulatory Visit
Admission: RE | Admit: 2019-06-25 | Discharge: 2019-06-25 | Disposition: A | Discharge status: Home / Self Care | Payer: No Typology Code available for payment source | Source: Ambulatory Visit | Attending: Obstetrics and Gynecology | Admitting: Obstetrics and Gynecology

## 2019-06-25 ENCOUNTER — Encounter: Payer: Self-pay | Admitting: Advanced Practice Midwife

## 2019-06-25 ENCOUNTER — Ambulatory Visit: Payer: Self-pay | Admitting: Advanced Practice Midwife

## 2019-06-25 VITALS — BP 128/84 | Temp 97.1°F | Wt 172.0 lb

## 2019-06-25 DIAGNOSIS — Z1239 Encounter for other screening for malignant neoplasm of breast: Secondary | ICD-10-CM

## 2019-06-25 DIAGNOSIS — Z1231 Encounter for screening mammogram for malignant neoplasm of breast: Secondary | ICD-10-CM

## 2019-06-25 NOTE — Progress Notes (Signed)
[name] is a 44 y.o. female who presents to [facility] clinic today with complaint of breast lump in right armpit. She has had for almost 17 years since she had her last child.     Pap Smear: Pap not smear completed today. Last Pap smear was 2019 at [facility] clinic and was normal. Per patient has no history of an abnormal Pap smear. Last Pap smear result is not available in Epic.   Physical exam: Breasts Breasts symmetrical. No skin abnormalities bilateral breasts. No nipple retraction bilateral breasts. No nipple discharge bilateral breasts. No lymphadenopathy.  5cmx5cm enlarged area in the right axilla. Patient states that this has been the same for 17 years. No changes, no pain.      Pelvic/Bimanual Pap is not indicated today    Smoking History: Patient has never smoked Not referred to quit line.    Patient Navigation: Patient education provided. Access to services provided for patient through [facility] program. Spanish interpreter provided. No transportation provided   Colorectal Cancer Screening: Per patient has never had colonoscopy completed No complaints today.    Breast and Cervical Cancer Risk Assessment: Patient does not have family history of breast cancer, known genetic mutations, or radiation treatment to the chest before age 24. Patient does not have history of cervical dysplasia, immunocompromised, or DES exposure in-utero.  Risk Assessment    Risk Scores      06/25/2019   Last edited by: Narda Rutherford, LPN   5-year risk: 0.3 %   Lifetime risk: 5.1 %          A: BCCCP exam without pap smear   P: Referred patient to [facility] for a screening mammogram. Appointment scheduled 06/25/2019 at 10:30.  Thressa Sheller DNP, CNM  06/25/19  9:35 AM

## 2019-08-23 ENCOUNTER — Other Ambulatory Visit: Payer: Self-pay

## 2019-08-23 ENCOUNTER — Ambulatory Visit (INDEPENDENT_AMBULATORY_CARE_PROVIDER_SITE_OTHER): Payer: Self-pay | Admitting: Family Medicine

## 2019-08-23 ENCOUNTER — Encounter: Payer: Self-pay | Admitting: Family Medicine

## 2019-08-23 VITALS — BP 108/76 | HR 82 | Ht 61.0 in | Wt 174.0 lb

## 2019-08-23 DIAGNOSIS — R002 Palpitations: Secondary | ICD-10-CM

## 2019-08-23 DIAGNOSIS — R300 Dysuria: Secondary | ICD-10-CM | POA: Insufficient documentation

## 2019-08-23 LAB — POCT UA - MICROSCOPIC ONLY

## 2019-08-23 LAB — POCT URINALYSIS DIP (MANUAL ENTRY)
Bilirubin, UA: NEGATIVE
Glucose, UA: NEGATIVE mg/dL
Ketones, POC UA: NEGATIVE mg/dL
Leukocytes, UA: NEGATIVE
Nitrite, UA: NEGATIVE
Protein Ur, POC: NEGATIVE mg/dL
Spec Grav, UA: 1.015 (ref 1.010–1.025)
Urobilinogen, UA: 0.2 E.U./dL
pH, UA: 7 (ref 5.0–8.0)

## 2019-08-23 NOTE — Assessment & Plan Note (Signed)
Patient with signs and symptoms concerning for urinary tract infection.  Urinalysis in the office negative for leuk esterase and nitrites.  No CVA tenderness appreciated on physical exam. -Obtain urine culture, follow-up with results, treat for UTI as needed

## 2019-08-23 NOTE — Patient Instructions (Addendum)
Thank you for coming in to see Amber Nixon today! Please see below to review our plan for today's visit:  1. COVID SHOT: [email or web address]  2. We will culture the urine to see if any bacteria grow, and can call you with results. If bacteria are growing we will treat with antibiotics.  3. Please come back to see Amber Nixon for concerns for vaginal discharge and burning with sex.   Please call the clinic at [phone] if your symptoms worsen or you have any concerns. It was our pleasure to serve you!     Dr. [facility]

## 2019-08-23 NOTE — Assessment & Plan Note (Signed)
Patient with intermittent palpitations that began in April 2020.  Occur infrequently, patient coughs after the start and then they go away shortly after.  No concerning signs such as chest pain, syncope.  Usually occur at rest.  No concerning family history for sudden cardiac death. -Continue to monitor -Strict return precautions given

## 2019-08-23 NOTE — Progress Notes (Signed)
    SUBJECTIVE:   CHIEF COMPLAINT / HPI:   This is a very pleasant Spanish-speaking patient reporting to our clinic today for the first time to establish care.  She also presents with a couple of concerns.  Question regarding Covid vaccine: Patient has a history of asthma and allergies, was wondering if she should get the Covid shot.  She had Covid in July 2020.  I strongly encouraged her to obtain this vaccination.  She reports that the [facility] pharmacy she goes to has them available, so she will go there to get it.  Dysuria: Patient reports that she recently had a urine infection for which she was treated with antibiotics.  She reports that since 1 week her symptoms have returned, the symptoms include burning with urination, urinary frequency, and sensation of incomplete voiding.  Urinalysis/dipstick was collected in the office, which was negative for leuk esterase and nitrites, was positive for blood.  Patient reports that her menstrual cycle is currently ending.  Denies chest pain, abdominal pain, fevers, flank pain, blood in urine, rashes, myalgias, and chills.  She denies any vaginal discharge.  Palpitations: Patient reports that since April 2020 she started noticing occasional palpitations.  Denies them being very strong, feels them for about 10 minutes then they go away.  She reports that she gets the sensation of palpitations in her chest which makes her cough.  After she coughs her symptoms go away shortly after.  She denies any chest pain with the palpitations, denies syncope, reports that she can do her usual activity without having any palpitations or chest pain.  Reports that the palpitations usually occur when she is at rest.  There is no family history of sudden cardiac death.  PERTINENT  PMH / PSH:  Allergy/asthma Covid infection July 2020 BMI 32.9 Spanish-speaking patient  OBJECTIVE:   BP 108/76   Pulse 82   Ht 5\' 1"  (1.549 m)   Wt 174 lb (78.9 kg)   LMP 08/20/2019 (Exact  Date)   SpO2 98%   BMI 32.88 kg/m    Physical exam: HEENT: Normocephalic, atraumatic, EOMI, PERRLA, patent nares bilaterally without nasal drainage, no pharyngeal erythema or tonsillar exudates; no thyromegaly, trachea midline Cardiac: RRR, S1-S2 present Respiratory: CTA bilaterally, comfortable work of breathing Abdomen: Normal bowel sounds appreciated, soft, nontender, no masses appreciated to palpation; no CVA tenderness Extremities: No edema, cyanosis, or deformity  ASSESSMENT/PLAN:   Intermittent palpitations Patient with intermittent palpitations that began in April 2020.  Occur infrequently, patient coughs after the start and then they go away shortly after.  No concerning signs such as chest pain, syncope.  Usually occur at rest.  No concerning family history for sudden cardiac death. -Continue to monitor -Strict return precautions given  Burning with urination Patient with signs and symptoms concerning for urinary tract infection.  Urinalysis in the office negative for leuk esterase and nitrites.  No CVA tenderness appreciated on physical exam. -Obtain urine culture, follow-up with results, treat for UTI as needed   May 2020, DO [facility]

## 2019-08-25 LAB — URINE CULTURE: Organism ID, Bacteria: NO GROWTH

## 2021-01-28 ENCOUNTER — Ambulatory Visit: Payer: Self-pay | Admitting: Internal Medicine

## 2021-04-07 ENCOUNTER — Encounter: Payer: Self-pay | Admitting: Internal Medicine

## 2021-04-07 ENCOUNTER — Ambulatory Visit: Payer: Self-pay | Admitting: Internal Medicine

## 2021-04-07 ENCOUNTER — Other Ambulatory Visit: Payer: Self-pay

## 2021-04-07 VITALS — BP 142/90 | HR 84 | Resp 12 | Ht 60.5 in | Wt 177.0 lb

## 2021-04-07 DIAGNOSIS — J4540 Moderate persistent asthma, uncomplicated: Secondary | ICD-10-CM

## 2021-04-07 DIAGNOSIS — E669 Obesity, unspecified: Secondary | ICD-10-CM | POA: Insufficient documentation

## 2021-04-07 DIAGNOSIS — K219 Gastro-esophageal reflux disease without esophagitis: Secondary | ICD-10-CM

## 2021-04-07 MED ORDER — FLUTICASONE-SALMETEROL 250-50 MCG/ACT IN AEPB: INHALATION_SPRAY | RESPIRATORY_TRACT | 11 refills | Status: DC

## 2021-04-07 MED ORDER — OMEPRAZOLE MAGNESIUM 20 MG PO TBEC: 20.0000 mg | DELAYED_RELEASE_TABLET | Freq: Every day | ORAL | 11 refills | Status: DC

## 2021-04-07 MED ORDER — ALBUTEROL SULFATE HFA 108 (90 BASE) MCG/ACT IN AERS: 2.0000 | INHALATION_SPRAY | Freq: Four times a day (QID) | RESPIRATORY_TRACT | 6 refills | Status: DC | PRN

## 2021-04-07 NOTE — Progress Notes (Signed)
Subjective:    Patient ID: Amber Nixon, female   DOB: October 05, 1975, 46 y.o.   MRN: [id number]   HPI  Here to establish  Amber Nixon interprets   Asthma:  Diagnosed when she was 46 years old.  Has remained stable when using meds regularly.   She does tend to have exacerbations as weather turns cold--generally January.   Using Advair 250/50 mg twice daily, generally 2 times weekly--feels better after using for a couple of days and stops the med, then uses again as not doing as well with breathing.  She made this change on her own.  She is using her Albuterol rescue inhaler generally every day during this time of year.    Does not wait in between puffs.  No spacer Has had influenza vaccine this fall and history of pneumococcal 23 in 2005. Has not had any COVID boosters.  Is working today and does not want a COVID bivalent booster.   She cannot remember when she had her last Td--one from 2021 was removed from chart.  2.  GERD:  taking Prilosec 20 mg daily.  Was taking Famotidine 40 mg in past, but was not working for her.  Has had since 2013 or so.  Describes acid into her throat.  Sometimes with vomiting.  Coffee and chocolate make it significantly worse  3.  Obesity: Discussed diet and physical activity.  Does not have goals.    Current Meds  Medication Sig   albuterol (PROVENTIL HFA;VENTOLIN HFA) 108 (90 BASE) MCG/ACT inhaler Inhale 2 puffs into the lungs every 6 (six) hours as needed for wheezing or shortness of breath.   famotidine (PEPCID) 40 MG tablet Take 40 mg by mouth daily.   Fluticasone-Salmeterol (ADVAIR) 250-50 MCG/DOSE AEPB Inhale 1 puff into the lungs every 12 (twelve) hours.   No Known Allergies  Past Medical History:  Diagnosis Date   Allergic rhinitis    OTC meds have not helped and patient prefers to not actively treat.     ANEMIA, MILD 02/26/2008        Asthma    1998   LIVER FUNCTION TESTS, ABNORMAL, HX OF 04/16/2009   No past surgical  history on file.  Family History  Problem Relation Age of Onset   Diabetes Mother        cause of death--complications   Hypertension Father    Hyperlipidemia Father        father   Hypertension Sister    Hypertension Sister    COPD Daughter    GER disease Daughter    Asthma Son    Asthma Other        aunts/uncles   Diabetes Other        mother   Social History   Socioeconomic History   Marital status: Married    Spouse name: Not on file   Number of children: 4   Years of education: Not on file   Highest education level: 6th grade  Occupational History   Not on file  Tobacco Use   Smoking status: Never   Smokeless tobacco: Never  Vaping Use   Vaping Use: Never used  Substance and Sexual Activity   Alcohol use: No   Drug use: No   Sexual activity: Yes    Birth control/protection: None    Comment: Did not like how BCPs made her feel  Other Topics Concern   Not on file  Social History Narrative   Lives with  Husband Lily Lovings who is establishing care here as well as 4 kids. Patient has a middle school education and speaks some english but requires Spanish interpreter.    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   Social Determinants of Health   Financial Resource Strain: Low Risk  (07/27/2021)   Overall Financial Resource Strain (CARDIA)    Difficulty of Paying Living Expenses: Not hard at all  Food Insecurity: No Food Insecurity (07/27/2021)   Hunger Vital Sign    Worried About Running Out of Food in the Last Year: Never true    Ran Out of Food in the Last Year: Never true  Transportation Needs: No Transportation Needs (07/27/2021)   PRAPARE - Hydrologist (Medical): No    Lack of Transportation (Non-Medical): No  Physical Activity: Not on file  Stress: Not on file  Social Connections: Not on file  Intimate Partner Violence: Not At Risk (07/27/2021)   Humiliation, Afraid, Rape, and Kick questionnaire    Fear of Current or  Ex-Partner: No    Emotionally Abused: No    Physically Abused: No    Sexually Abused: No      Review of Systems    Objective:   BP (!) 142/90 (BP Location: Left Arm, Patient Position: Sitting, Cuff Size: Normal)   Pulse 84   Resp 12   Ht 5' 0.5" (1.537 m)   Wt 177 lb (80.3 kg)   LMP 03/23/2021 (Exact Date)   BMI 34.00 kg/m   Physical Exam NAD Obese HEENT:  PERRL, EOMI, TMs pearly gray, throat without injection. Neck:  Supple, No adenopathy, no thyromegaly. Lungs:  CTA withgood air movement. CV:  RRR with normal S1 and S2, No S3, S4 or murmur.  No carotid bruits.  Carotid, radial and DP pulses normal and equal. Abd:  S, NT, No HSM or mass, + BS LE:  No edema   Assessment & Plan    Asthma:  Explained need to utilize Advair twice daily and not the limited number of times she is using weekly.  Discussed goal to not need rescue inhaler more than once weekly.  Spacer given.  Meds refilled.    2.  GERD:  Omeprazole refilled.  Discussed weight loss may help this as well.  Went over reflux precautions.  3.  Obesity:  discussed healthy diet and making small incremental weekly goals to improve diet and add physical activity for weight loss.  4.  HM:  encourage COVID bivalent booster in near future.  CPE with pap and fasting labs in 3 months.

## 2021-04-07 NOTE — Patient Instructions (Signed)

## 2021-07-15 ENCOUNTER — Other Ambulatory Visit: Payer: Self-pay

## 2021-07-15 DIAGNOSIS — E669 Obesity, unspecified: Secondary | ICD-10-CM

## 2021-07-15 DIAGNOSIS — Z13220 Encounter for screening for lipoid disorders: Secondary | ICD-10-CM

## 2021-07-16 LAB — CBC WITH DIFFERENTIAL/PLATELET
Basophils Absolute: 0.1 10*3/uL (ref 0.0–0.2)
Basos: 1 %
EOS (ABSOLUTE): 0.1 10*3/uL (ref 0.0–0.4)
Eos: 2 %
Hematocrit: 44.4 % (ref 34.0–46.6)
Hemoglobin: 15 g/dL (ref 11.1–15.9)
Immature Grans (Abs): 0 10*3/uL (ref 0.0–0.1)
Immature Granulocytes: 0 %
Lymphocytes Absolute: 2.1 10*3/uL (ref 0.7–3.1)
Lymphs: 33 %
MCH: 30.3 pg (ref 26.6–33.0)
MCHC: 33.8 g/dL (ref 31.5–35.7)
MCV: 90 fL (ref 79–97)
Monocytes Absolute: 0.3 10*3/uL (ref 0.1–0.9)
Monocytes: 4 %
Neutrophils Absolute: 4 10*3/uL (ref 1.4–7.0)
Neutrophils: 60 %
Platelets: 382 10*3/uL (ref 150–450)
RBC: 4.95 x10E6/uL (ref 3.77–5.28)
RDW: 12.9 % (ref 11.7–15.4)
WBC: 6.6 10*3/uL (ref 3.4–10.8)

## 2021-07-16 LAB — LIPID PANEL W/O CHOL/HDL RATIO
Cholesterol, Total: 164 mg/dL (ref 100–199)
HDL: 46 mg/dL (ref >39)
LDL Chol Calc (NIH): 92 mg/dL (ref 0–99)
Triglycerides: 150 mg/dL — ABNORMAL HIGH (ref 0–149)
VLDL Cholesterol Cal: 26 mg/dL (ref 5–40)

## 2021-07-16 LAB — COMPREHENSIVE METABOLIC PANEL
ALT: 46 IU/L — ABNORMAL HIGH (ref 0–32)
AST: 30 IU/L (ref 0–40)
Albumin/Globulin Ratio: 2 (ref 1.2–2.2)
Albumin: 4.7 g/dL (ref 3.8–4.8)
Alkaline Phosphatase: 80 IU/L (ref 44–121)
BUN/Creatinine Ratio: 12 (ref 9–23)
BUN: 8 mg/dL (ref 6–24)
Bilirubin Total: 0.5 mg/dL (ref 0.0–1.2)
CO2: 23 mmol/L (ref 20–29)
Calcium: 9.1 mg/dL (ref 8.7–10.2)
Chloride: 103 mmol/L (ref 96–106)
Creatinine, Ser: 0.67 mg/dL (ref 0.57–1.00)
Globulin, Total: 2.4 g/dL (ref 1.5–4.5)
Glucose: 91 mg/dL (ref 70–99)
Potassium: 4.4 mmol/L (ref 3.5–5.2)
Sodium: 140 mmol/L (ref 134–144)
Total Protein: 7.1 g/dL (ref 6.0–8.5)
eGFR: 110 mL/min/{1.73_m2} (ref >59)

## 2021-07-16 LAB — HEMOGLOBIN A1C
Est. average glucose Bld gHb Est-mCnc: 94 mg/dL
Hgb A1c MFr Bld: 4.9 % (ref 4.8–5.6)

## 2021-07-19 ENCOUNTER — Ambulatory Visit: Payer: Self-pay | Admitting: Internal Medicine

## 2021-07-23 ENCOUNTER — Ambulatory Visit: Payer: Self-pay | Admitting: Internal Medicine

## 2021-07-27 ENCOUNTER — Other Ambulatory Visit: Payer: Self-pay | Admitting: Internal Medicine

## 2021-07-27 ENCOUNTER — Encounter: Payer: Self-pay | Admitting: Internal Medicine

## 2021-07-27 ENCOUNTER — Ambulatory Visit: Payer: Self-pay | Admitting: Internal Medicine

## 2021-07-27 VITALS — BP 132/80 | HR 80 | Resp 12 | Ht 60.0 in | Wt 170.0 lb

## 2021-07-27 DIAGNOSIS — J4540 Moderate persistent asthma, uncomplicated: Secondary | ICD-10-CM

## 2021-07-27 DIAGNOSIS — Z23 Encounter for immunization: Secondary | ICD-10-CM

## 2021-07-27 DIAGNOSIS — M722 Plantar fascial fibromatosis: Secondary | ICD-10-CM

## 2021-07-27 DIAGNOSIS — Z0000 Encounter for general adult medical examination without abnormal findings: Secondary | ICD-10-CM

## 2021-07-27 DIAGNOSIS — J302 Other seasonal allergic rhinitis: Secondary | ICD-10-CM

## 2021-07-27 DIAGNOSIS — Z124 Encounter for screening for malignant neoplasm of cervix: Secondary | ICD-10-CM

## 2021-07-27 DIAGNOSIS — E669 Obesity, unspecified: Secondary | ICD-10-CM

## 2021-07-27 DIAGNOSIS — K219 Gastro-esophageal reflux disease without esophagitis: Secondary | ICD-10-CM

## 2021-07-27 MED ORDER — CITRACAL PLUS PO TABS: ORAL_TABLET | ORAL | 0 refills | Status: DC

## 2021-07-27 MED ORDER — FLUTICASONE PROPIONATE 50 MCG/ACT NA SUSP: 2.0000 | Freq: Every day | NASAL | 6 refills | Status: AC

## 2021-07-27 MED ORDER — KETOTIFEN FUMARATE 0.025 % OP SOLN: 1.0000 [drp] | Freq: Two times a day (BID) | OPHTHALMIC | 0 refills | Status: AC

## 2021-07-27 MED ORDER — OMEPRAZOLE 40 MG PO CPDR: 40.0000 mg | DELAYED_RELEASE_CAPSULE | Freq: Every day | ORAL | 11 refills | Status: DC

## 2021-07-27 NOTE — Patient Instructions (Signed)
Heel cups for shoes ?

## 2021-07-27 NOTE — Progress Notes (Signed)
Subjective:    Patient ID: Amber Nixon, female   DOB: 12-28-1975, 46 y.o.   MRN: [id number]   HPI  Tildon Husky interprets  CPE with pap  1.  Pap:  Last was 3-4 years ago at clinic on [address].  Always normal.    2.  Mammogram:  Only mammogram she has had was 06/25/19 and normal.  No family history of breast cancer.  She would like to wait to obtain another mammogram as lower risk.  3.  Osteoprevention:  Feels even almond milk worsens her GERD symptoms and leads to bloating much like cow's milk.  Does not like soy milk.  She walks every day 20-30 minutes.  Willing to gradually increase to 60 minutes daily.    4.  Guaiac Cards/FIT: Never.   5.  Colonoscopy:  Never.  No family history of colon cancer.    6.  Immunizations:  Has only had 2 baseline Moderna vaccines, last in 2021.  Due for Tdap as well.   Immunization History  Administered Date(s) Administered   Influenza Split 01/19/2012   Influenza Whole 01/24/2007, 03/16/2009, 01/07/2010   Influenza,inj,Quad PF,6+ Mos 01/08/2021   Moderna Sars-Covid-2 Vaccination 11/08/2019, 12/07/2019   Pneumococcal Polysaccharide-23 02/26/2004   Td 07/27/2003, 03/29/2006     7.  Glucose/Cholesterol:   History of gestational DM with recent A1C at 4.9% 07/15/21.  Cholesterol at good range save for triglycerides. Lipid Panel     Component Value Date/Time   CHOL 164 07/15/2021 0854   TRIG 150 (H) 07/15/2021 0854   HDL 46 07/15/2021 0854   CHOLHDL 2.5 05/19/2006 0000   VLDL 14 05/19/2006 0000   LDLCALC 92 07/15/2021 0854   LABVLDL 26 07/15/2021 0854     Current Meds  Medication Sig   albuterol (VENTOLIN HFA) 108 (90 Base) MCG/ACT inhaler Inhale 2 puffs into the lungs every 6 (six) hours as needed for wheezing or shortness of breath.   fluticasone-salmeterol (ADVAIR) 250-50 MCG/ACT AEPB 1 inhalation twice daily   Loratadine 10 MG CAPS Take by mouth.   omeprazole (PRILOSEC OTC) 20 MG tablet Take 1 tablet (20 mg  total) by mouth daily.   No Known Allergies  Past Medical History:  Diagnosis Date   Allergic rhinitis    OTC meds have not helped and patient prefers to not actively treat.     ANEMIA, MILD 02/26/2008        Asthma    1998   LIVER FUNCTION TESTS, ABNORMAL, HX OF 04/16/2009   History reviewed. No pertinent surgical history.  Family History  Problem Relation Age of Onset   Diabetes Mother        cause of death--complications   Hypertension Father    Hyperlipidemia Father        father   Hypertension Sister    Hypertension Sister    COPD Daughter    GER disease Daughter    Asthma Son    Asthma Other        aunts/uncles   Diabetes Other        mother   Social History   Socioeconomic History   Marital status: Married    Spouse name: Not on file   Number of children: 4   Years of education: Not on file   Highest education level: 6th grade  Occupational History   Not on file  Tobacco Use   Smoking status: Never   Smokeless tobacco: Never  Vaping Use   Vaping  Use: Never used  Substance and Sexual Activity   Alcohol use: No   Drug use: No   Sexual activity: Yes    Birth control/protection: None    Comment: Did not like how BCPs made her feel  Other Topics Concern   Not on file  Social History Narrative   Lives with Husband Evelena LeydenJose Garcia who is establishing care here as well as 4 kids. Patient has a middle school education and speaks some english but requires Spanish interpreter.    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   Social Determinants of Health   Financial Resource Strain: Low Risk    Difficulty of Paying Living Expenses: Not hard at all  Food Insecurity: No Food Insecurity   Worried About Programme researcher, broadcasting/film/videounning Out of Food in the Last Year: Never true   Ran Out of Food in the Last Year: Never true  Transportation Needs: No Transportation Needs   Lack of Transportation (Medical): No   Lack of Transportation (Non-Medical): No  Physical Activity: Not on file   Stress: Not on file  Social Connections: Not on file  Intimate Partner Violence: Not At Risk   Fear of Current or Ex-Partner: No   Emotionally Abused: No   Physically Abused: No   Sexually Abused: No       Review of Systems  HENT:         Problems with pollen:  watery itchy eyes, nasal congestion, runny nose, sneezing, ear and throat itching past 3 weeks.  Taking Loratadine 10 mg daily with some help but not enough.    Gastrointestinal:        Taking Omeprazole 20 mg daily for GERD in the morning on empty stomach.  Still having epigastric bloating after eating for about 5 hours after a meal.  Feels thick saliva coming up from epigastrium into her throat and vomits this up after every meal.  Has been going on for a long time.  Had before last appt here.  Fine when awakens in the morning.   Does have HOB elevated. Denies usual food causes save for tomato and onion.   Does not lie down after eating--sits up for at least 2 hours. No melena or hematochezia  Musculoskeletal:        Left heel pain when bears weight, particularly after sitting down for a while and then bearing weight again.   No bare or stockinged feet.     Objective:   BP 132/80 (BP Location: Left Arm, Patient Position: Sitting, Cuff Size: Normal)   Pulse 80   Resp 12   Ht 5' (1.524 m)   Wt 170 lb (77.1 kg)   LMP 07/10/2021 (Exact Date)   BMI 33.20 kg/m   Physical Exam HENT:     Head: Normocephalic and atraumatic.     Right Ear: Tympanic membrane, ear canal and external ear normal.     Left Ear: Tympanic membrane, ear canal and external ear normal.     Nose: Congestion and rhinorrhea present.     Mouth/Throat:     Mouth: Mucous membranes are moist.     Comments: Mild cobbling of posterior pharynx. Eyes:     Extraocular Movements: Extraocular movements intact.     Conjunctiva/sclera: Conjunctivae normal.     Pupils: Pupils are equal, round, and reactive to light.     Comments: No conjunctival injection,  but watery. Discs sharp.  Neck:     Thyroid: No thyroid mass or thyromegaly.  Cardiovascular:  Rate and Rhythm: Normal rate and regular rhythm.     Heart sounds: S1 normal and S2 normal. No murmur heard.    No friction rub. No S3 or S4 sounds.     Comments: No carotid bruits.  Carotid, radial, femoral, DP and PT pulses normal and equal.    Pulmonary:     Effort: Pulmonary effort is normal.     Breath sounds: Normal breath sounds.  Chest:  Breasts:    Right: No inverted nipple, mass or nipple discharge.     Left: No inverted nipple, mass or nipple discharge.  Abdominal:     General: Bowel sounds are normal.     Palpations: Abdomen is soft. There is no hepatomegaly, splenomegaly or mass.     Tenderness: There is no abdominal tenderness.     Hernia: No hernia is present.  Genitourinary:    Comments: Normal external female genitalia No vaginal discharge. No cervical or vaginal mucosal lesions. No uterine or adnexal mass or tenderness.  Musculoskeletal:        General: Normal range of motion.     Cervical back: Normal range of motion and neck supple.     Right lower leg: No edema.     Left lower leg: No edema.  Feet:     Comments: Tender over plantar aspect of left heel.   Lymphadenopathy:     Head:     Right side of head: No submental or submandibular adenopathy.     Left side of head: No submental or submandibular adenopathy.     Cervical: No cervical adenopathy.     Upper Body:     Right upper body: No supraclavicular or axillary adenopathy.     Left upper body: No supraclavicular or axillary adenopathy.     Lower Body: No right inguinal adenopathy. No left inguinal adenopathy.  Skin:    General: Skin is warm.     Capillary Refill: Capillary refill takes less than 2 seconds.     Findings: No rash.  Neurological:     General: No focal deficit present.     Mental Status: She is alert and oriented to person, place, and time.     Cranial Nerves: Cranial nerves 2-12 are  intact.     Sensory: Sensation is intact.     Motor: Motor function is intact.     Coordination: Coordination is intact.     Gait: Gait is intact.     Deep Tendon Reflexes: Reflexes are normal and symmetric.  Psychiatric:        Mood and Affect: Mood normal.        Speech: Speech normal.        Behavior: Behavior normal. Behavior is cooperative.      Assessment & Plan    CPE with pap FIT testing to return in 2 weeks Mammogram discussion again next year, she would like to wait for now.   Would like to return for COVID vaccine--long road trip planned for tomorrow. Encouraged influenza vaccine in fall. Tdap Citrated calcium with Vitamin D twice daily.  2.  GERD:  increase omeprazole to 40 mg daily.  Cut back on Tomatoes and onions.  3.  Asthma:  much better control with regular use of Advair.    4.  Allergies:  continue Loratadine.  Add Fluticasone nasal spray, 2 sprays each nostril daily and Zaditor eye drops.  5.  Obesity:  to continue to work on making goals for diet and physical activity.  6.  Plantar fasciitis:  discussed heel cups, stretches, Ibuprofen twice daily for 2 weeks.    5.

## 2021-07-29 LAB — CYTOLOGY - PAP

## 2021-08-28 IMAGING — MG DIGITAL SCREENING BILAT W/ TOMO W/ CAD
6 of 10 series · 6 of 30 positions shown · non-contrast
Comparison: None.

CLINICAL DATA: Screening.

EXAM:
DIGITAL SCREENING BILATERAL MAMMOGRAM WITH TOMO AND CAD

[L CC synth-2D (1 of 2)]
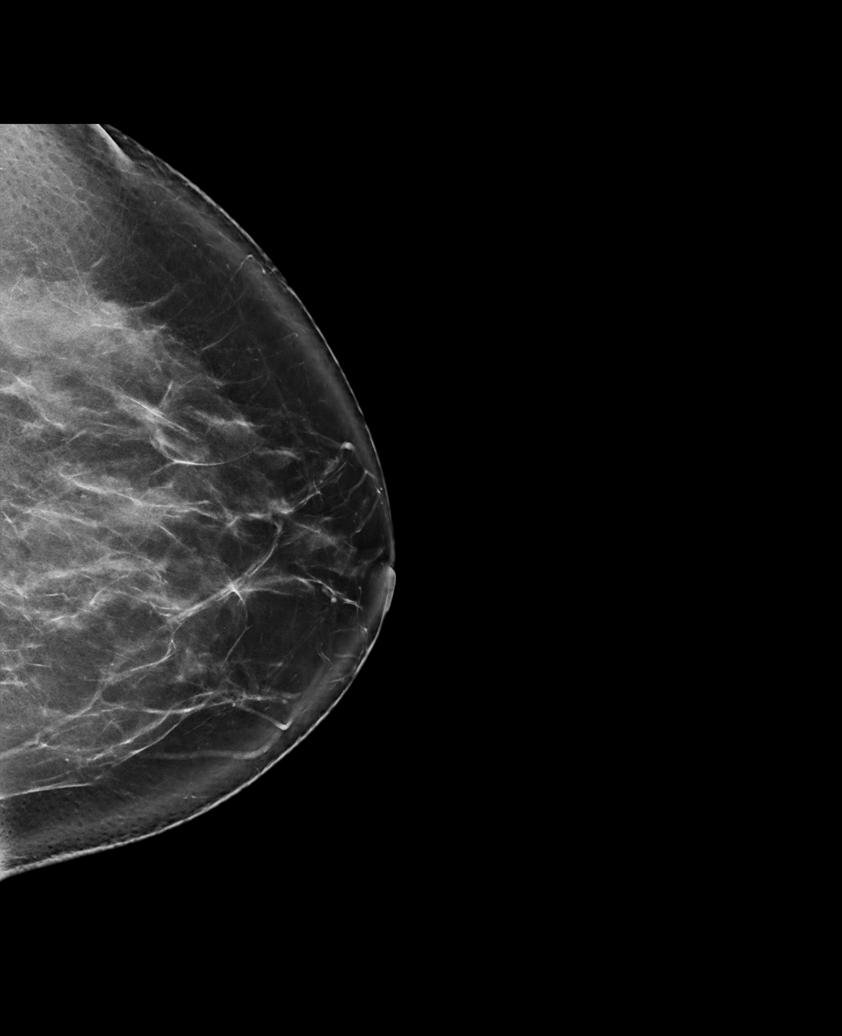

[R CC synth-2D]
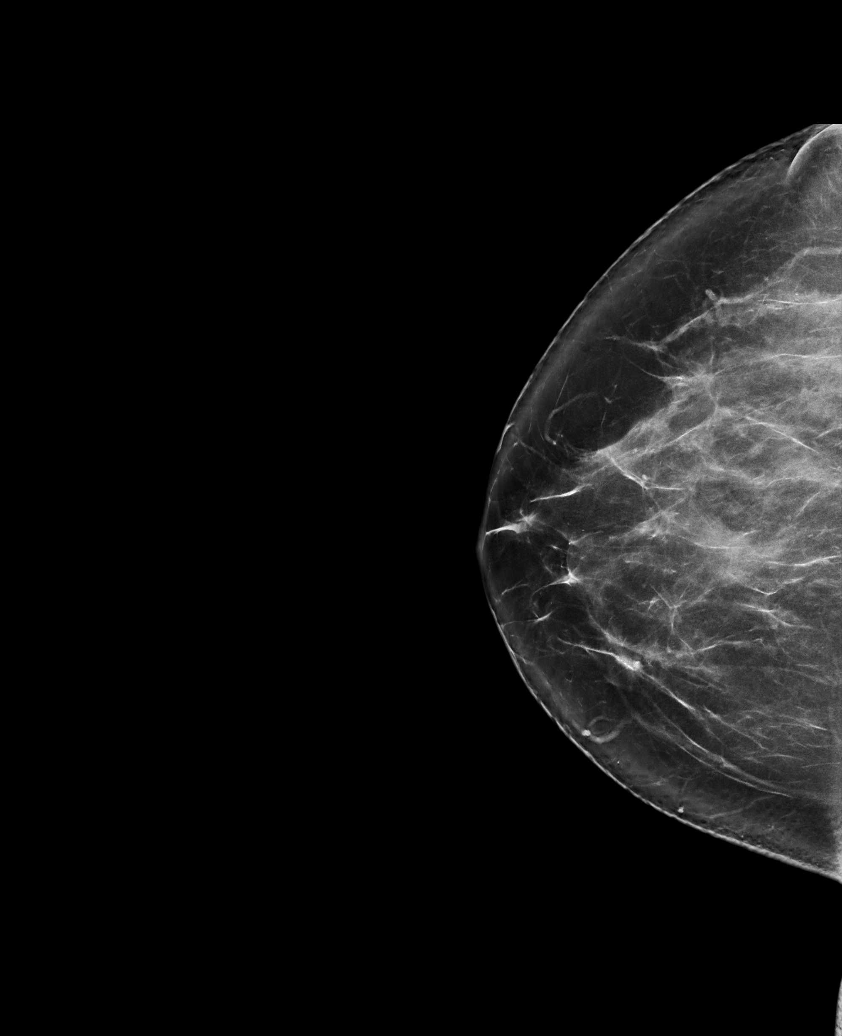

[L MLO synth-2D]
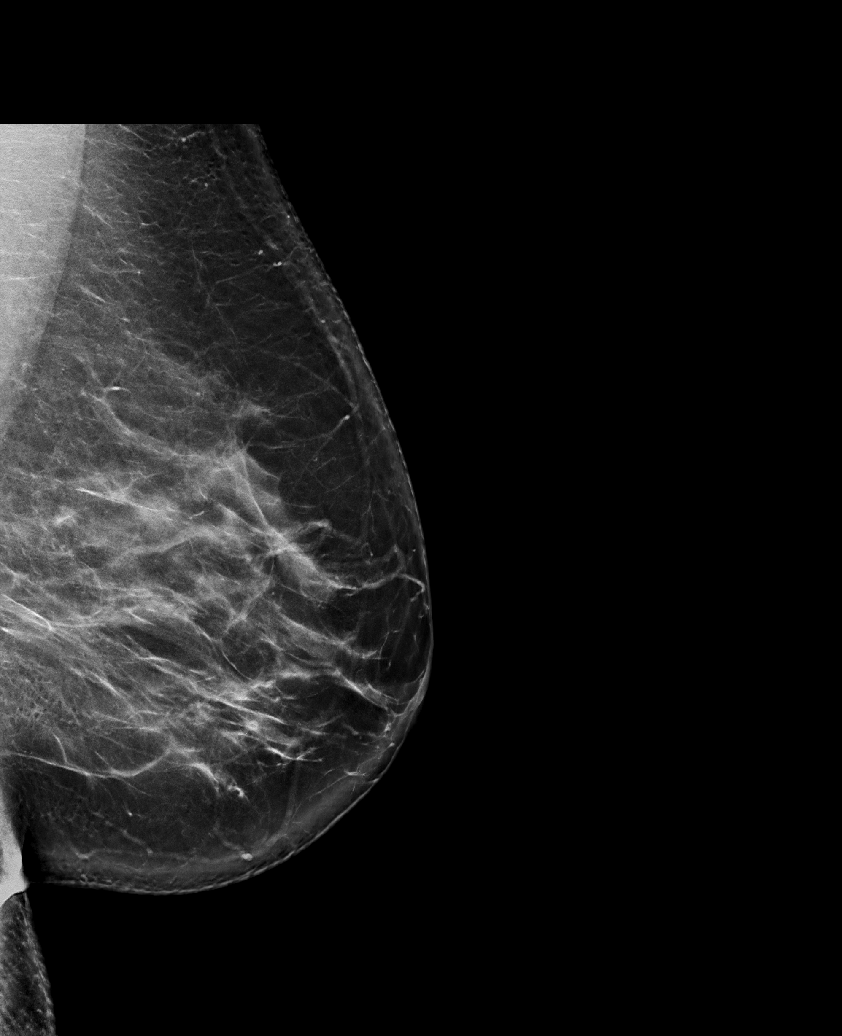

[R MLO synth-2D]
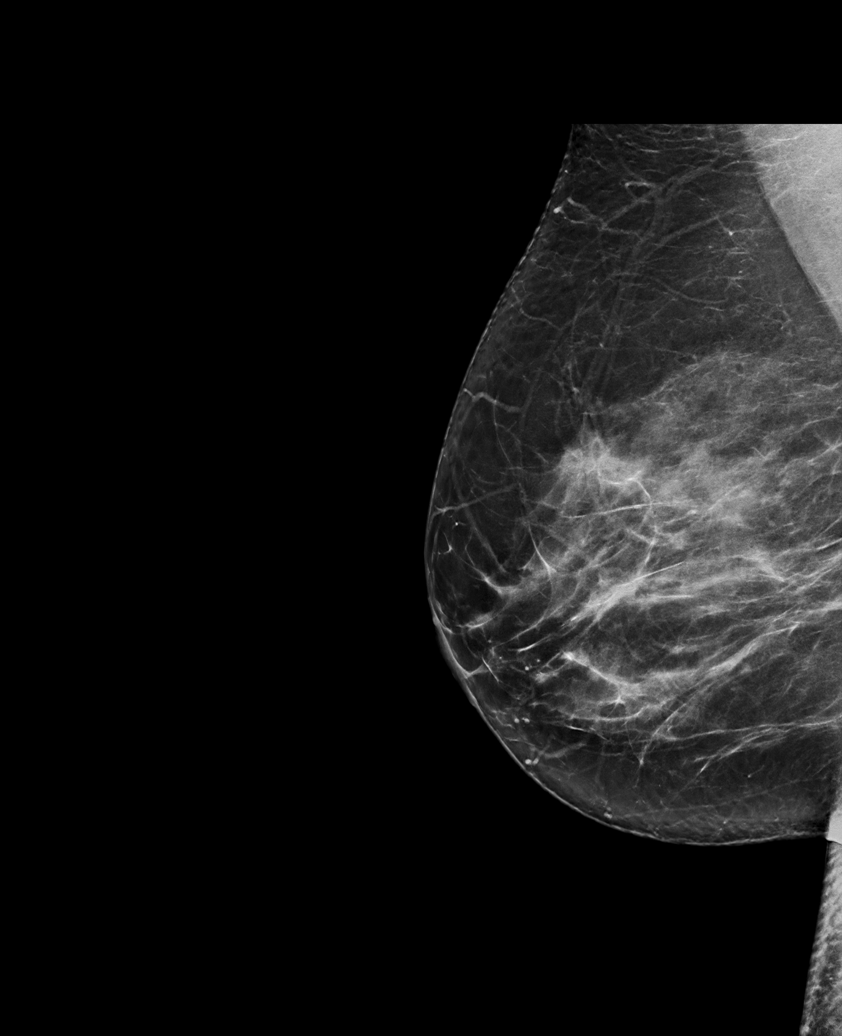

[L CC synth-2D (2 of 2)]
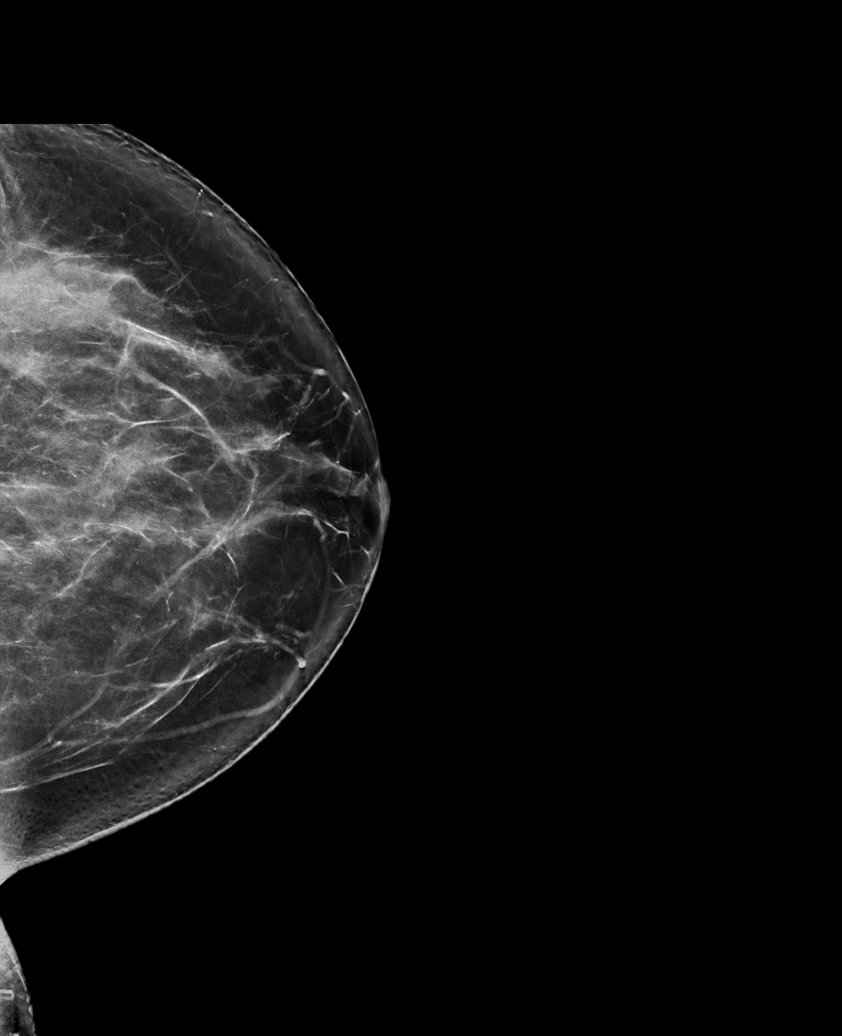

[L MLO tomo · tomo slice 47/92.0]
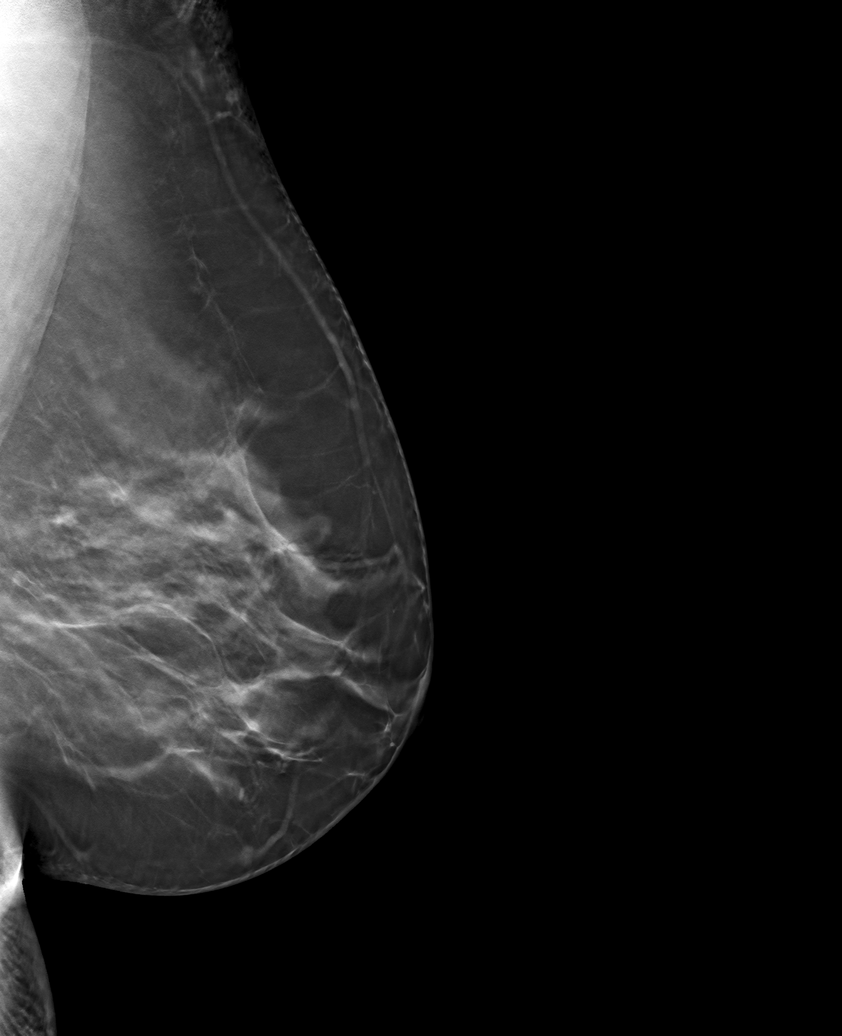

[6 of 30 positions shown; findings below may reference images not displayed]

ACR Breast Density Category c: The breast tissue is heterogeneously
dense, which may obscure small masses
FINDINGS: There are no findings suspicious for malignancy. Images were
processed with CAD.
IMPRESSION: No mammographic evidence of malignancy. A result letter of this
screening mammogram will be mailed directly to the patient.

RECOMMENDATION:
Screening mammogram in one year. (Code:EM-2-IHY)

BI-RADS CATEGORY  1: Negative.

## 2021-09-27 ENCOUNTER — Telehealth: Payer: Self-pay

## 2021-09-27 NOTE — Telephone Encounter (Signed)
Patient would like an appointment after experiencing heel pain for about 2 months. Patient has noticed she has had callous skin on her heel that has now been shedding. Patient has some pain which makes it hard to walk. Patient is using an ointment, but is unsure of the name.

## 2021-10-01 NOTE — Telephone Encounter (Signed)
Please go ahead and make appt next available for her

## 2021-12-14 NOTE — Telephone Encounter (Signed)
Attempted to called patient to offer an appointment a number of times but have not been able to reach her. Have left voicemail's asking to call us back

## 2022-01-28 ENCOUNTER — Ambulatory Visit: Payer: Self-pay | Admitting: Internal Medicine

## 2022-01-28 ENCOUNTER — Encounter: Payer: Self-pay | Admitting: Internal Medicine

## 2022-01-28 VITALS — BP 122/80 | HR 80 | Resp 16 | Ht 60.0 in | Wt 175.0 lb

## 2022-01-28 DIAGNOSIS — M722 Plantar fascial fibromatosis: Secondary | ICD-10-CM

## 2022-01-28 DIAGNOSIS — K219 Gastro-esophageal reflux disease without esophagitis: Secondary | ICD-10-CM

## 2022-01-28 NOTE — Progress Notes (Signed)
    Subjective:    Patient ID: Amber Nixon, female   DOB: 04-Nov-1975, 46 y.o.   MRN: [id number]   HPI  [name] interprets   GERD:  at CPE increased Omeprazole  to 40 mg daily and asked her to limit foods that seem to exacerbate.  She states she is doing much better.  States for most part, if she is asymptomatic unless eats tomatoes or spicy food too much.  Taking Omeprazole daily on empty stomach.    2.  Plantar Fasciitis:  States mainly left foot. The pain is much worse--limping and now affecting right knee as she bears weight mainly on that side.  She did not obtain heel cups, just has a thin gel cushion for her left shoe.  She only wears shoes with thick sole and cushion, though the shoes she has today really have no arch support.   States she has been doing stretching exercises and rolling the frozen water bottle with her foot.    3.  HM:  thinks she did get an influenza vaccine at some sort of health fair/trailer event.  Cannot recall which month. Not documented in [address].  Not interested in COVID vaccine today.    Current Meds  Medication Sig   albuterol (VENTOLIN HFA) 108 (90 Base) MCG/ACT inhaler Inhale 2 puffs into the lungs every 6 (six) hours as needed for wheezing or shortness of breath.   fluticasone (FLONASE) 50 MCG/ACT nasal spray Place 2 sprays into both nostrils daily.   fluticasone-salmeterol (ADVAIR) 250-50 MCG/ACT AEPB 1 inhalation twice daily   ketotifen (ZADITOR) 0.025 % ophthalmic solution Place 1 drop into both eyes 2 (two) times daily.   Loratadine 10 MG CAPS Take by mouth.   omeprazole (PRILOSEC) 40 MG capsule Take 1 capsule (40 mg total) by mouth daily.   No Known Allergies   Review of Systems    Objective:   BP 122/80 (BP Location: Right Arm, Patient Position: Sitting, Cuff Size: Normal)   Pulse 80   Resp 16   Ht 5' (1.524 m)   Wt 175 lb (79.4 kg)   LMP 01/09/2022   BMI 34.18 kg/m   Physical Exam NAD Does limp to exam  table Lungs:  CTA CV:  RRR without murmur or rub.  Radial and DP pulses normal and equal Abd:  Obese, S, NT, No HSM or mass. Left foot:  relatively high arch.  Tender at plantar heel all the way to MT heads on plantar surface.  No erythema, nodules, swelling. Thick soled keds tennis shoes with thin gel heel cushion only in left shoe, but no actual cup.   Assessment & Plan   GERD:  controlled with dietary changes and increase in Omeprazole.  2.  Left plantar Fasciitis:  Discussed no walking bare foot ever--and to get good cushioned slippers for getting up in night to BR and walking around in home.   Encouraged stretching before getting out of bed. Showed her the type of heel cup would like her to use and use bilaterally. Referral to Podiatry.

## 2022-03-10 ENCOUNTER — Encounter: Payer: Self-pay | Admitting: Internal Medicine

## 2022-03-10 ENCOUNTER — Ambulatory Visit: Payer: Self-pay | Admitting: Internal Medicine

## 2022-03-10 VITALS — BP 138/88 | HR 88 | Temp 97.8°F | Resp 16 | Ht 60.0 in | Wt 174.0 lb

## 2022-03-10 DIAGNOSIS — R0989 Other specified symptoms and signs involving the circulatory and respiratory systems: Secondary | ICD-10-CM

## 2022-03-10 DIAGNOSIS — J069 Acute upper respiratory infection, unspecified: Secondary | ICD-10-CM

## 2022-03-10 DIAGNOSIS — J4531 Mild persistent asthma with (acute) exacerbation: Secondary | ICD-10-CM

## 2022-03-10 LAB — POC COVID19 BINAXNOW: SARS Coronavirus 2 Ag: NEGATIVE

## 2022-03-10 LAB — POCT INFLUENZA A/B
Influenza A, POC: NEGATIVE
Influenza B, POC: NEGATIVE

## 2022-03-10 NOTE — Patient Instructions (Signed)
Neti pot once to twice daily and cool mist humidifier--make sure filter is clean

## 2022-03-10 NOTE — Progress Notes (Signed)
    Subjective:    Patient ID: Amber Nixon, female   DOB: 22-May-1975, 46 y.o.   MRN: [id number]   HPI  Daughter, Amber Nixon, interprets.  Monday, 4 days ago, started with sore throat.  Two days later, developed chest tightness and frontal headache as well as stuffy hearing.  Has posterior pharyngeal drainage, but not coughing.  Yesterday, developed nasal congestion.  No fever. COVID and influenza testing here today are negative.   Two weeks ago, her 14 yo son with similar symptoms.   No definite fever. Equate Cold and Flu helping a bit, but albuterol every 6 hours helps more so with chest tightness.  Relief lasts about 6 hours.   Using Advair twice daily--does brush teeth and tongue afterward.   She did not get a flu or covid vaccine yet this year.    Current Meds  Medication Sig   albuterol (VENTOLIN HFA) 108 (90 Base) MCG/ACT inhaler Inhale 2 puffs into the lungs every 6 (six) hours as needed for wheezing or shortness of breath.   fluticasone (FLONASE) 50 MCG/ACT nasal spray Place 2 sprays into both nostrils daily.   fluticasone-salmeterol (ADVAIR) 250-50 MCG/ACT AEPB 1 inhalation twice daily   Loratadine 10 MG CAPS Take by mouth.   omeprazole (PRILOSEC) 40 MG capsule Take 1 capsule (40 mg total) by mouth daily.  Equate Cold and Flu:  Acetaminophen, DM, Guaifenesin, Phenylephrine.  Last dose was yesterday--helps a bit.    No Known Allergies   Review of Systems    Objective:   BP 138/88 (BP Location: Left Arm, Patient Position: Sitting, Cuff Size: Normal)   Pulse 88   Temp 97.8 F (36.6 C) (Oral)   Resp 16   Ht 5' (1.524 m)   Wt 174 lb (78.9 kg)   BMI 33.98 kg/m   Physical Exam HEENT:  PERRL, EOMI, TMs bulging, but without erythema.  Nasal mucosa mildly erythematous wit clear discharge.  Mild posterior pharyngeal erythema/lymphoid swelling.  Mild tenderness over frontal or maxillary sinuses bilaterally Neck:  Supple, No adenopathy Chest:  CTA with  good air movement CV:  RRR without murmur or rub. Abd:  S, NT, No HSM or mass. + BS LE:  No edema   Assessment & Plan    Viral URI with mild asthma exacerbation:  continue with current treatment.  Appears to be improving.  Neti pot and cool mist humidifier as well as pushing fluids.  Call if worsens or not improving after 7 days.

## 2022-03-31 ENCOUNTER — Other Ambulatory Visit: Payer: Self-pay | Admitting: Internal Medicine

## 2022-05-18 ENCOUNTER — Telehealth: Payer: Self-pay | Admitting: Psychology

## 2022-05-25 ENCOUNTER — Other Ambulatory Visit: Payer: Self-pay

## 2022-05-25 MED ORDER — OMEPRAZOLE 20 MG PO CPDR: 20.0000 mg | DELAYED_RELEASE_CAPSULE | Freq: Every day | ORAL | 8 refills | Status: AC

## 2022-06-13 ENCOUNTER — Ambulatory Visit: Payer: Self-pay | Attending: Podiatry | Admitting: Podiatry

## 2022-06-13 DIAGNOSIS — M722 Plantar fascial fibromatosis: Secondary | ICD-10-CM

## 2022-06-13 MED ORDER — MELOXICAM 15 MG PO TABS: 15.0000 mg | ORAL_TABLET | Freq: Every day | ORAL | 1 refills | Status: DC

## 2022-06-13 MED ORDER — METHYLPREDNISOLONE 4 MG PO TBPK: ORAL_TABLET | ORAL | 0 refills | Status: DC

## 2022-06-13 NOTE — Progress Notes (Signed)
   No chief complaint on file.   Subjective: 47 y.o. female presenting today for evaluation of left heel pain has been going on for about 8 months now.  Gradual onset.  Denies a history of injury.  Patient works on her feet 8 hours a day at [employer].  Most painful in the mornings when getting out of bed or when standing after sitting for a while.   Past Medical History:  Diagnosis Date   Allergic rhinitis    OTC meds have not helped and patient prefers to not actively treat.     ANEMIA, MILD 02/26/2008        Asthma    1998   LIVER FUNCTION TESTS, ABNORMAL, HX OF 04/16/2009     Objective: Physical Exam General: The patient is alert and oriented x3 in no acute distress.  Dermatology: Skin is warm, dry and supple bilateral lower extremities. Negative for open lesions or macerations bilateral.   Vascular: Dorsalis Pedis and Posterior Tibial pulses palpable bilateral.  Capillary fill time is immediate to all digits.  Neurological: Epicritic and protective threshold intact bilateral.   Musculoskeletal: Tenderness to palpation to the plantar aspect of the left heel along the plantar fascia. All other joints range of motion within normal limits bilateral. Strength 5/5 in all groups bilateral.   Assessment: 1. Plantar fasciitis left foot  Plan of Care:  1. Patient evaluated. Xrays reviewed.   2. Injection of 0.5cc Celestone soluspan injected into the left plantar fascia.  3. Rx for Medrol Dose Pak placed 4. Rx for Meloxicam ordered for patient. 5.  Recommend OTC arch supports available on [place].  Recommended Superfeet, power steps, or protalus insoles 6.  Advised against going barefoot.  Recommend good supportive shoes and sneakers 7. Return to clinic as needed   Edrick Kins, DPM [facility]  Dr. Edrick Kins, DPM    [address], [place] [address]                Office [phone]  Fax [phone]

## 2022-06-15 ENCOUNTER — Other Ambulatory Visit: Payer: Self-pay | Admitting: Internal Medicine

## 2022-07-18 ENCOUNTER — Ambulatory Visit: Payer: Self-pay | Admitting: Podiatry

## 2022-07-29 ENCOUNTER — Encounter: Payer: Self-pay | Admitting: Internal Medicine

## 2022-08-01 ENCOUNTER — Telehealth: Payer: Self-pay

## 2022-08-01 NOTE — Telephone Encounter (Signed)
Patient wants to be on waiting list incase there is any cancellation before her appointment in July. Patient stated that she continues to have foot pain she has been seen by the specialist before but pain continues patient said she does take tylenol at times but pain is always there. She would like an appointment to see if she can get a referral to a specialist again.

## 2022-09-02 NOTE — Telephone Encounter (Signed)
Patient has been scheduled sooner.

## 2022-09-05 ENCOUNTER — Ambulatory Visit: Payer: Self-pay | Admitting: Internal Medicine

## 2022-09-05 ENCOUNTER — Encounter: Payer: Self-pay | Admitting: Internal Medicine

## 2022-09-05 VITALS — BP 130/80 | HR 80 | Resp 16 | Ht 60.0 in | Wt 174.0 lb

## 2022-09-05 DIAGNOSIS — Z1159 Encounter for screening for other viral diseases: Secondary | ICD-10-CM

## 2022-09-05 DIAGNOSIS — M722 Plantar fascial fibromatosis: Secondary | ICD-10-CM

## 2022-09-05 DIAGNOSIS — Z5948 Other specified lack of adequate food: Secondary | ICD-10-CM

## 2022-09-05 DIAGNOSIS — Z9189 Other specified personal risk factors, not elsewhere classified: Secondary | ICD-10-CM

## 2022-09-05 DIAGNOSIS — Z1231 Encounter for screening mammogram for malignant neoplasm of breast: Secondary | ICD-10-CM

## 2022-09-05 DIAGNOSIS — Z0000 Encounter for general adult medical examination without abnormal findings: Secondary | ICD-10-CM

## 2022-09-05 DIAGNOSIS — E781 Pure hyperglyceridemia: Secondary | ICD-10-CM

## 2022-09-05 DIAGNOSIS — L858 Other specified epidermal thickening: Secondary | ICD-10-CM

## 2022-09-05 MED ORDER — FLUTICASONE-SALMETEROL 250-50 MCG/ACT IN AEPB: 1.0000 | INHALATION_SPRAY | Freq: Two times a day (BID) | RESPIRATORY_TRACT | 11 refills | Status: DC

## 2022-09-05 MED ORDER — CALCIUM CITRATE + D 250-5 MG-MCG PO TABS: ORAL_TABLET | ORAL | Status: DC

## 2022-09-05 NOTE — Progress Notes (Signed)
Subjective:    Patient ID: Amber Nixon, female   DOB: 11/29/1975, 47 y.o.   MRN: [id number]   HPI  Tereasa Coop interprets  CPE without pap  1.  Pap:  Last 07/27/2021 with benign reparative changes.  2.  Mammogram:  Last 05/2019 and normal.  No family history of breast cancer.  3.  Osteoprevention:  No dairy intake.  No intake of calcium or vitamin D.  Not walking.    4.  Guaiac Cards/FIT:  did not return last year.    5.  Colonoscopy:  Never.  No family history of colon cancer.    6.  Immunizations:  Did not get influenza vaccine this past fall nor newer COVID booster.  Felt poorly for 1 week after one of COVID vaccines.  Discussed her increased risk for bad outcome with influenza or COVID should she get infected. Immunization History  Administered Date(s) Administered   Influenza Split 01/19/2012   Influenza Whole 01/24/2007, 03/16/2009, 01/07/2010   Influenza,inj,Quad PF,6+ Mos 01/08/2021   Moderna Sars-Covid-2 Vaccination 11/08/2019, 12/07/2019   Pneumococcal Polysaccharide-23 02/26/2004   Td 07/27/2003, 03/29/2006   Tdap 07/27/2021     7.  Glucose/Cholesterol: Blood glucose has been mildly high in past, but A1C always in low normal range.  Borderline high trigs in past. Lipid Panel     Component Value Date/Time   CHOL 164 07/15/2021 0854   TRIG 150 (H) 07/15/2021 0854   HDL 46 07/15/2021 0854   CHOLHDL 2.5 05/19/2006 0000   VLDL 14 05/19/2006 0000   LDLCALC 92 07/15/2021 0854   LABVLDL 26 07/15/2021 0854     8.  Left Plantar Fasciitis:  Had this injected in March with Dr. Logan Bores.  She called back as did not improve.  She does have arch supports for her work shoes and does move them to other shoes.  Was also given a medrol dose pak and Meloxicam.  She states it helped for a week and then returned.  Wearing sandals she was told by the foot clinic were good for her condition.  Current Meds  Medication Sig   cetirizine (ZYRTEC) 10 MG tablet  Take 10 mg by mouth daily.   fluticasone (FLONASE) 50 MCG/ACT nasal spray Place 2 sprays into both nostrils daily.   fluticasone-salmeterol (ADVAIR DISKUS) 250-50 MCG/ACT AEPB INHALE 1 INHALATION INTO THE LUNGS 2 TIMES A DAY.   ketotifen (ZADITOR) 0.025 % ophthalmic solution Place 1 drop into both eyes 2 (two) times daily.   meloxicam (MOBIC) 15 MG tablet Take 1 tablet (15 mg total) by mouth daily.   omeprazole (PRILOSEC) 20 MG capsule Take 1 capsule (20 mg total) by mouth daily.   PROAIR HFA 108 (90 Base) MCG/ACT inhaler INHALE 2 PUFFS INTO THE LUNGS EVERY 6 HOURS AS NEEDED FOR WHEEZING OR SHORTNESS OF BREATH.     No Known Allergies  Past Medical History:  Diagnosis Date   Allergic rhinitis    OTC meds have not helped and patient prefers to not actively treat.     ANEMIA, MILD 02/26/2008        Asthma    1998   LIVER FUNCTION TESTS, ABNORMAL, HX OF 04/16/2009   Obesity (BMI 30.0-34.9)     No past surgical history on file.  Family History  Problem Relation Age of Onset   Diabetes Mother        cause of death--complications   Hypertension Father    Hyperlipidemia Father  father   Hypertension Sister    Hypertension Sister    GER disease Daughter    Asthma Son    Asthma Other        aunts/uncles   Diabetes Other        mother   Social History   Socioeconomic History   Marital status: Married    Spouse name: Evelena Leyden   Number of children: 4   Years of education: Not on file   Highest education level: 6th grade  Occupational History   Occupation: Human resources officer.  Tobacco Use   Smoking status: Never    Passive exposure: Current (At work)   Smokeless tobacco: Never  Vaping Use   Vaping Use: Never used  Substance and Sexual Activity   Alcohol use: No   Drug use: No   Sexual activity: Yes    Birth control/protection: Condom    Comment: Did not like how BCPs made her feel  Other Topics Concern   Not on file  Social History Narrative   Lives with  Husband Evelena Leyden who is establishing care here as well as 4 kids.    Patient has a middle school education and speaks some English but requires Spanish interpreter.    Social Determinants of Health   Financial Resource Strain: Low Risk  (09/05/2022)   Overall Financial Resource Strain (CARDIA)    Difficulty of Paying Living Expenses: Not very hard  Food Insecurity: Food Insecurity Present (09/05/2022)   Hunger Vital Sign    Worried About Running Out of Food in the Last Year: Sometimes true    Ran Out of Food in the Last Year: Sometimes true  Transportation Needs: No Transportation Needs (09/05/2022)   PRAPARE - Administrator, Civil Service (Medical): No    Lack of Transportation (Non-Medical): No  Physical Activity: Not on file  Stress: Not on file  Social Connections: Not on file  Intimate Partner Violence: Not At Risk (09/05/2022)   Humiliation, Afraid, Rape, and Kick questionnaire    Fear of Current or Ex-Partner: No    Emotionally Abused: No    Physically Abused: No    Sexually Abused: No      Review of Systems  HENT:  Negative for dental problem.   Respiratory:  Negative for shortness of breath and wheezing.        Allergies and Asthma controlled with Advair. She is only using albuterol when she is exposed to a chemical type odor.  Generally once weekly.  Cardiovascular:  Negative for chest pain, palpitations and leg swelling.  Gastrointestinal:  Negative for abdominal pain and blood in stool (No melena).  Genitourinary:  Negative for menstrual problem and vaginal discharge.  Musculoskeletal:        Left plantar fasciitis--see HPI.    Neurological:  Negative for weakness and numbness.  Psychiatric/Behavioral:  Negative for dysphoric mood. The patient is not nervous/anxious.       Objective:   BP 130/80 (BP Location: Left Arm, Patient Position: Sitting, Cuff Size: Normal)   Pulse 80   Resp 16   Ht 5' (1.524 m)   Wt 174 lb (78.9 kg)   BMI 33.98 kg/m    Physical Exam Constitutional:      Appearance: She is obese.  HENT:     Head: Normocephalic and atraumatic.     Right Ear: Tympanic membrane, ear canal and external ear normal.     Left Ear: Tympanic membrane, ear canal and external ear normal.  Nose: Nose normal.     Mouth/Throat:     Mouth: Mucous membranes are moist.     Pharynx: Oropharynx is clear.  Eyes:     Extraocular Movements: Extraocular movements intact.     Pupils: Pupils are equal, round, and reactive to light.     Comments: Discs sharp  Neck:     Thyroid: No thyroid mass or thyromegaly.  Cardiovascular:     Rate and Rhythm: Normal rate and regular rhythm.     Heart sounds: S1 normal and S2 normal. No murmur heard.    No friction rub. No S3 or S4 sounds.     Comments: No carotid bruits.  Carotid, radial, femoral, DP and PT pulses normal and equal.   Pulmonary:     Effort: Pulmonary effort is normal.     Breath sounds: Normal breath sounds and air entry.  Chest:  Breasts:    Right: No inverted nipple, mass, nipple discharge or skin change.     Left: No inverted nipple, mass, nipple discharge or skin change.  Abdominal:     General: Bowel sounds are normal.     Palpations: Abdomen is soft. There is no hepatomegaly, splenomegaly or mass.     Tenderness: There is no abdominal tenderness.     Hernia: No hernia is present.  Genitourinary:    Comments: Normal external female genitalia No uterine or adnexal mass or tenderness Musculoskeletal:        General: Normal range of motion.     Cervical back: Normal range of motion and neck supple.     Right lower leg: No edema.     Left lower leg: No edema.  Feet:     Comments: Tender over left plantar heel Lymphadenopathy:     Head:     Right side of head: No submental or submandibular adenopathy.     Left side of head: No submental or submandibular adenopathy.     Cervical: No cervical adenopathy.     Upper Body:     Right upper body: No supraclavicular or  axillary adenopathy.     Left upper body: No supraclavicular or axillary adenopathy.     Lower Body: No right inguinal adenopathy. No left inguinal adenopathy.  Skin:    General: Skin is warm.     Capillary Refill: Capillary refill takes less than 2 seconds.     Comments: Keratosis pilaris on back and upper outer arms  Neurological:     General: No focal deficit present.     Mental Status: She is alert and oriented to person, place, and time.     Cranial Nerves: Cranial nerves 2-12 are intact.     Sensory: Sensation is intact.     Motor: Motor function is intact.     Coordination: Coordination is intact.     Gait: Gait is intact.     Deep Tendon Reflexes: Reflexes are normal and symmetric.  Psychiatric:        Mood and Affect: Mood normal.        Speech: Speech normal.        Behavior: Behavior normal. Behavior is cooperative.      Assessment & Plan   CPE without pap Mammogram ordered Refused COVID vaccine due to feeling ill for 1 week after one of her previous vaccines. Encouraged calling in every September for influenza clinic CBC, CMP, FLP, as not outside often or with much  in way of dairy, Check Vitamin D level.  Hep C  screen  2.  Plantar fasciitis, left foot:  refer back to [facility] with another provider as she was not reappointed due to Dr. Logan Bores no longer being with practice Will also refer to [facility]   3.  Obesity:  urged her to work on diet and get into a pool with her foot pain to treat both her weight and the foot issue.    4. Need for dental care:  dental clinic referral.

## 2022-09-05 NOTE — Addendum Note (Signed)
Addended by: Marcene Duos on: 09/05/2022 02:58 PM   Modules accepted: Orders

## 2022-09-05 NOTE — Progress Notes (Signed)
[facility] Referral to [facility].  Referral to [facility]

## 2022-09-06 LAB — CBC WITH DIFFERENTIAL/PLATELET
Basophils Absolute: 0.1 10*3/uL (ref 0.0–0.2)
Basos: 1 %
EOS (ABSOLUTE): 0.2 10*3/uL (ref 0.0–0.4)
Eos: 2 %
Hematocrit: 43.5 % (ref 34.0–46.6)
Hemoglobin: 14.2 g/dL (ref 11.1–15.9)
Immature Grans (Abs): 0 10*3/uL (ref 0.0–0.1)
Immature Granulocytes: 0 %
Lymphocytes Absolute: 2.5 10*3/uL (ref 0.7–3.1)
Lymphs: 36 %
MCH: 29.7 pg (ref 26.6–33.0)
MCHC: 32.6 g/dL (ref 31.5–35.7)
MCV: 91 fL (ref 79–97)
Monocytes Absolute: 0.3 10*3/uL (ref 0.1–0.9)
Monocytes: 5 %
Neutrophils Absolute: 3.9 10*3/uL (ref 1.4–7.0)
Neutrophils: 56 %
Platelets: 341 10*3/uL (ref 150–450)
RBC: 4.78 x10E6/uL (ref 3.77–5.28)
RDW: 12.6 % (ref 11.7–15.4)
WBC: 7 10*3/uL (ref 3.4–10.8)

## 2022-09-06 LAB — VITAMIN D 25 HYDROXY (VIT D DEFICIENCY, FRACTURES): Vit D, 25-Hydroxy: 17.7 ng/mL — ABNORMAL LOW (ref 30.0–100.0)

## 2022-09-06 LAB — COMPREHENSIVE METABOLIC PANEL
ALT: 32 IU/L (ref 0–32)
AST: 22 IU/L (ref 0–40)
Albumin/Globulin Ratio: 1.8
Albumin: 4.6 g/dL (ref 3.9–4.9)
Alkaline Phosphatase: 80 IU/L (ref 44–121)
BUN/Creatinine Ratio: 17 (ref 9–23)
BUN: 11 mg/dL (ref 6–24)
Bilirubin Total: 0.3 mg/dL (ref 0.0–1.2)
CO2: 21 mmol/L (ref 20–29)
Calcium: 9 mg/dL (ref 8.7–10.2)
Chloride: 106 mmol/L (ref 96–106)
Creatinine, Ser: 0.66 mg/dL (ref 0.57–1.00)
Globulin, Total: 2.5 g/dL (ref 1.5–4.5)
Glucose: 96 mg/dL (ref 70–99)
Potassium: 4.2 mmol/L (ref 3.5–5.2)
Sodium: 140 mmol/L (ref 134–144)
Total Protein: 7.1 g/dL (ref 6.0–8.5)
eGFR: 110 mL/min/{1.73_m2} (ref >59)

## 2022-09-06 LAB — LIPID PANEL W/O CHOL/HDL RATIO
Cholesterol, Total: 158 mg/dL (ref 100–199)
HDL: 47 mg/dL (ref >39)
LDL Chol Calc (NIH): 91 mg/dL (ref 0–99)
Triglycerides: 110 mg/dL (ref 0–149)
VLDL Cholesterol Cal: 20 mg/dL (ref 5–40)

## 2022-09-06 LAB — HEPATITIS C ANTIBODY: Hep C Virus Ab: NONREACTIVE

## 2022-10-13 ENCOUNTER — Encounter: Payer: Self-pay | Admitting: Internal Medicine

## 2022-11-22 ENCOUNTER — Other Ambulatory Visit: Payer: Self-pay

## 2022-11-22 DIAGNOSIS — R300 Dysuria: Secondary | ICD-10-CM

## 2022-11-22 NOTE — Progress Notes (Signed)
Patient has been experiencing dysuria, back pain (left side), and vaginal itching on the outside for the past 4 days. Patient denies odor, discharge and blood in her urine. Patient has not taken any medication.  Patient instructed to drink a lot of water and get OTC AZO while we wait for urine culture to come back.

## 2022-11-24 LAB — URINE CULTURE

## 2022-12-19 ENCOUNTER — Other Ambulatory Visit: Payer: Self-pay

## 2022-12-19 DIAGNOSIS — R7989 Other specified abnormal findings of blood chemistry: Secondary | ICD-10-CM

## 2022-12-20 LAB — VITAMIN D 25 HYDROXY (VIT D DEFICIENCY, FRACTURES): Vit D, 25-Hydroxy: 22.9 ng/mL — ABNORMAL LOW (ref 30.0–100.0)

## 2023-04-03 ENCOUNTER — Other Ambulatory Visit: Payer: Self-pay

## 2023-04-04 ENCOUNTER — Other Ambulatory Visit: Payer: Self-pay

## 2023-04-06 ENCOUNTER — Other Ambulatory Visit: Payer: Self-pay

## 2023-04-06 DIAGNOSIS — R7989 Other specified abnormal findings of blood chemistry: Secondary | ICD-10-CM

## 2023-04-07 LAB — VITAMIN D 25 HYDROXY (VIT D DEFICIENCY, FRACTURES): Vit D, 25-Hydroxy: 25.5 ng/mL — ABNORMAL LOW (ref 30.0–100.0)

## 2023-05-08 ENCOUNTER — Telehealth: Payer: Self-pay

## 2023-05-08 NOTE — Telephone Encounter (Signed)
 Patient called today and stated that she had labs done a month ago and was told we would call her with results, Patient stated she has not been called and would like to know her lab results.

## 2023-05-10 NOTE — Telephone Encounter (Signed)
Spoke with patient and her friend per patient approval states she is taking 1000 units daily of [employer] store brand

## 2023-05-15 ENCOUNTER — Other Ambulatory Visit: Payer: Self-pay | Admitting: Internal Medicine

## 2023-05-15 MED ORDER — VITAMIN D3 25 MCG (1000 UT) PO CAPS: 1000.0000 [IU] | ORAL_CAPSULE | Freq: Every day | ORAL | Status: DC

## 2023-05-30 ENCOUNTER — Telehealth: Payer: Self-pay

## 2023-05-30 NOTE — Telephone Encounter (Signed)
 Patient missed her Podiatry appointment through [facility]. Patient would like referral to be resent to get another appointment. Patient already spoke with folks at [facility] and they told her they cannot just make her another appointment.

## 2023-05-31 NOTE — Telephone Encounter (Signed)
 Patient called to report that she no showed her podiatry appointment through [facility]. Unfortunately [facility] reported that she cannot be referred for podiatry for 6 months due to their no show policy. Patient is still having the same foot pain and wants to know if there is any other option to get evaluated.

## 2023-06-07 ENCOUNTER — Ambulatory Visit: Payer: Self-pay | Admitting: Podiatry

## 2023-06-09 NOTE — Telephone Encounter (Signed)
 Patient would like to wait to be sent referred through [facility] again.

## 2023-06-13 NOTE — Telephone Encounter (Signed)
 Patient has decided that she would like to wait to redo [facility] referral in a few months.

## 2023-06-13 NOTE — Telephone Encounter (Signed)
 I cannot recall what we decided about this--does she want to apply for financial aid with [facility] or [facility] and get it soon or wait 6 months for a new referral?

## 2023-06-26 ENCOUNTER — Encounter: Payer: Self-pay | Admitting: Internal Medicine

## 2023-06-26 ENCOUNTER — Ambulatory Visit: Payer: Self-pay | Admitting: Internal Medicine

## 2023-06-26 VITALS — BP 132/80 | HR 97 | Resp 20 | Ht 60.0 in | Wt 178.0 lb

## 2023-06-26 DIAGNOSIS — T7840XA Allergy, unspecified, initial encounter: Secondary | ICD-10-CM

## 2023-06-26 MED ORDER — PREDNISONE 20 MG PO TABS: ORAL_TABLET | ORAL | 0 refills | Status: DC

## 2023-06-26 MED ORDER — DIPHENHYDRAMINE HCL 25 MG PO TABS: ORAL_TABLET | ORAL | Status: AC

## 2023-06-26 NOTE — Patient Instructions (Signed)
 Cold compresses to face, neck and ears for 10 minutes 2-3 times daily Call if worsens or fever

## 2023-06-26 NOTE — Progress Notes (Signed)
    Subjective:    Patient ID: Francene Ing, female   DOB: [birth year], 48 y.o.   MRN: [id number]   HPI  Laveta Pottier interprets  Dyed her hair on Thursday evening and developed red bumps on ears and retroauricular area and onto neck and cheeks in front of ears the following morning.  Itches.  She is taking Cetirizine 10 mg daily, thinks she started about 2 weeks ago, well before current complaints.  Has not treated in any other way. No problems with breathing or swelling of airway.    No Known Allergies   Review of Systems    Objective:   BP 132/80 (BP Location: Left Arm, Patient Position: Sitting, Cuff Size: Normal)   Pulse 97   Resp 20   Ht 5' (1.524 m)   Wt 178 lb (80.7 kg)   LMP 05/13/2023   BMI 34.76 kg/m   Physical Exam NAD HEENT:  PERRL, EOMI, conjunctivae without injection.  TMs pearly gray, throat without injection Ear pinnae with erythema and mild swelling.  Red and edematous over cheeks and lower eyelids.   Neck:  Supple, No adenopathy Chest:  CTA CV:  RRR without murmur or rub.  Radial pulses normal and equal     Assessment & Plan  Allergic reaction or contact dermatitis:  cool compresses or showers with water hitting face for 10 minutes twice daily.  Prednisone  20 mg daily for 5 days.   Benadryl  25 mg as needed at bedtime. Continue Cetirizine 10 mg in AM.

## 2023-07-17 DIAGNOSIS — T7840XA Allergy, unspecified, initial encounter: Secondary | ICD-10-CM | POA: Insufficient documentation

## 2023-09-05 ENCOUNTER — Other Ambulatory Visit: Payer: Self-pay

## 2023-09-07 ENCOUNTER — Encounter: Payer: Self-pay | Admitting: Internal Medicine

## 2023-09-07 ENCOUNTER — Ambulatory Visit: Payer: Self-pay | Admitting: Internal Medicine

## 2023-09-07 VITALS — BP 134/84 | HR 87 | Resp 16 | Ht 60.0 in | Wt 177.0 lb

## 2023-09-07 DIAGNOSIS — N951 Menopausal and female climacteric states: Secondary | ICD-10-CM

## 2023-09-07 DIAGNOSIS — L719 Rosacea, unspecified: Secondary | ICD-10-CM | POA: Insufficient documentation

## 2023-09-07 DIAGNOSIS — R7989 Other specified abnormal findings of blood chemistry: Secondary | ICD-10-CM | POA: Insufficient documentation

## 2023-09-07 DIAGNOSIS — Z1231 Encounter for screening mammogram for malignant neoplasm of breast: Secondary | ICD-10-CM

## 2023-09-07 DIAGNOSIS — Z0000 Encounter for general adult medical examination without abnormal findings: Secondary | ICD-10-CM

## 2023-09-07 DIAGNOSIS — L570 Actinic keratosis: Secondary | ICD-10-CM | POA: Insufficient documentation

## 2023-09-07 DIAGNOSIS — L821 Other seborrheic keratosis: Secondary | ICD-10-CM | POA: Insufficient documentation

## 2023-09-07 DIAGNOSIS — E66811 Obesity, class 1: Secondary | ICD-10-CM

## 2023-09-07 MED ORDER — DOXYCYCLINE HYCLATE 100 MG PO TABS: ORAL_TABLET | ORAL | 0 refills | Status: AC

## 2023-09-07 MED ORDER — METRONIDAZOLE 0.75 % EX GEL: CUTANEOUS | 11 refills | Status: AC

## 2023-09-07 MED ORDER — NIZORAL 1 % EX SHAM: MEDICATED_SHAMPOO | CUTANEOUS | Status: AC

## 2023-09-07 NOTE — Patient Instructions (Signed)
 Tome un vaso de agua antes de cada comida Tome un minimo de 6 a 8 vasos de agua diarios Coma tres veces al dia Coma una proteina y Neomia Dear grasa saludable con comida.  (huevos, pescado, pollo, pavo, y limite carnes rojas Coma 5 porciones diarias de legumbres.  Mezcle los colores Coma 2 porciones diarias de frutas con cascara cuando sea comestible Use platos pequeos Suelte su tenedor o cuchara despues de cada mordida hata que se mastique y se trague Come en la mesa con amigos o familiares por lo menos una vez al dia Apague la televisin y aparatos electrnicos durante la comida  Su objetivo debe ser perder una libra por semana  Estudios recientes indican que las personas quienes consumen todos de sus calorias durante 12 horas se bajan de pesocon Mas eficiencia.  Por ejemplo, si Usted come su primera comida a las 7:00 a.m., su comida final del dia se debe completar antes de las 7:00 p.m.

## 2023-09-07 NOTE — Progress Notes (Signed)
 Subjective:    Patient ID: Amber Nixon, female   DOB: [birth year], 48 y.o.   MRN: [id number]   HPI  CPE without pap  1.  Pap:  Last 07/2021 and normal.    2.  Mammogram:  Last 05/2019 and normal.  No family history of breast cancer.    3.  Osteoprevention:   Taking Calcium  600 mg with Vitamin D  3 800 international units  once daily.  Discussed should be twice daily and likely reason she has not normalized her Vitamin D  level, last checked in January and resulted at 25.5.    4.  Guaiac Cards/FIT:  Has never returned.    5.  Colonoscopy:  Never.    6.  Immunizations:  States she obtained an influenza vaccine at church 01/2023.  Has not received COVID vaccination since initial 2 in 2021.   Immunization History  Administered Date(s) Administered   Influenza Split 01/19/2012   Influenza Whole 01/24/2007, 03/16/2009, 01/07/2010   Influenza,inj,Quad PF,6+ Mos 01/08/2021   Moderna Sars-Covid-2 Vaccination 11/08/2019, 12/07/2019   Novel Infuenza-h1n1-09 03/01/2008   Pneumococcal Polysaccharide-23 02/26/2004   Td 07/27/2003, 03/29/2006   Tdap 07/27/2021     7.  Glucose/Cholesterol:  Glucose, A1C and cholesterol panels fine in past  Lipid Panel     Component Value Date/Time   CHOL 158 09/05/2022 1007   TRIG 110 09/05/2022 1007   HDL 47 09/05/2022 1007   CHOLHDL 2.5 05/19/2006 0000   VLDL 14 05/19/2006 0000   LDLCALC 91 09/05/2022 1007   LABVLDL 20 09/05/2022 1007     Current Meds  Medication Sig   Calcium  Carbonate-Vitamin D  600-10 MG-MCG TABS Take 1 tablet by mouth 2 (two) times daily.   cetirizine (ZYRTEC) 10 MG tablet Take 10 mg by mouth daily.   diphenhydrAMINE  (BENADRYL  ALLERGY) 25 MG tablet Una tableta cada noche por la boca   fluticasone -salmeterol (WIXELA INHUB) 250-50 MCG/ACT AEPB Inhale 1 puff into the lungs in the morning and at bedtime.   ketotifen  (ZADITOR ) 0.025 % ophthalmic solution Place 1 drop into both eyes 2 (two) times daily.    omeprazole  (PRILOSEC ) 20 MG capsule Take 1 capsule (20 mg total) by mouth daily.   PROAIR  HFA 108 (90 Base) MCG/ACT inhaler INHALE 2 PUFFS INTO THE LUNGS EVERY 6 HOURS AS NEEDED FOR WHEEZING OR SHORTNESS OF BREATH.   [DISCONTINUED] Calcium  Citrate-Vitamin D  (CALCIUM  CITRATE + D) 250-5 MG-MCG TABS 2 tabs by mouth twice daily   [DISCONTINUED] Cholecalciferol (VITAMIN D3) 25 MCG (1000 UT) CAPS Take 1 capsule (1,000 Units total) by mouth daily.       No Known Allergies  Past Medical History:  Diagnosis Date   Allergic rhinitis    OTC meds have not helped and patient prefers to not actively treat.     ANEMIA, MILD 02/26/2008        Asthma    1998   LIVER FUNCTION TESTS, ABNORMAL, HX OF 04/16/2009   Obesity (BMI 30.0-34.9)    History reviewed. No pertinent surgical history.  Family History  Problem Relation Age of Onset   Diabetes Mother        cause of death--complications   Hypertension Father    Hyperlipidemia Father        father   Hypertension Sister    Hypertension Sister    GER disease Daughter    Asthma Son    Asthma Other        aunts/uncles   Social History  Socioeconomic History   Marital status: Married    Spouse name: Thirza Fleet   Number of children: 4   Years of education: Not on file   Highest education level: 6th grade  Occupational History   Occupation: Housewife  Tobacco Use   Smoking status: Never    Passive exposure: Past (At work)   Smokeless tobacco: Never  Vaping Use   Vaping status: Never Used  Substance and Sexual Activity   Alcohol use: No   Drug use: No   Sexual activity: Yes    Birth control/protection: None    Comment: Did not like how BCPs made her feel  Other Topics Concern   Not on file  Social History Narrative   Lives with Husband Thirza Fleet who is a patient here as well as 3 youngest kids.    .    Social Drivers of Health   Financial Resource Strain: Low Risk  (09/07/2023)   Overall Financial Resource Strain (CARDIA)     Difficulty of Paying Living Expenses: Not very hard  Food Insecurity: No Food Insecurity (09/07/2023)   Hunger Vital Sign    Worried About Running Out of Food in the Last Year: Never true    Ran Out of Food in the Last Year: Never true  Transportation Needs: No Transportation Needs (09/07/2023)   PRAPARE - Administrator, Civil Service (Medical): No    Lack of Transportation (Non-Medical): No  Physical Activity: Not on file  Stress: Not on file  Social Connections: Not on file  Intimate Partner Violence: Not At Risk (09/07/2023)   Humiliation, Afraid, Rape, and Kick questionnaire    Fear of Current or Ex-Partner: No    Emotionally Abused: No    Physically Abused: No    Sexually Abused: No      Review of Systems  Constitutional:        Concerned about weight.   Tries to eat healthy Walks about 30-40 minutes daily.    HENT:  Positive for dental problem (Cavity in tooth, lower left.  no pain currently, but she is concerned the tooth is no longer viable.).   Genitourinary:  Positive for menstrual problem (Hot flashes and sweats in the night.  Has missed her period now for 2 months.  No birth control.).  Skin:        Skin is dry and having rashes on her face--points to lateral upper cheek area-flakes off after showers.   Her cheeks get red easily--has noted for years.   Lesion on upper left eyelid seems to be enlarging.  Has noted for 4 years.  No bleeding.  No color change.  No flaking of lesion.  No itching.        Objective:   BP 134/84 (BP Location: Right Arm, Patient Position: Sitting, Cuff Size: Normal)   Pulse 87   Resp 16   Ht 5' (1.524 m)   Wt 177 lb (80.3 kg)   SpO2 98%   BMI 34.57 kg/m   Physical Exam Constitutional:      Appearance: She is obese.  HENT:     Head: Normocephalic and atraumatic.     Ears:     Comments: Scarring of bilateral TMs    Nose: Nose normal.     Mouth/Throat:     Mouth: Mucous membranes are moist.     Pharynx:  Oropharynx is clear.   Eyes:     Extraocular Movements: Extraocular movements intact.     Conjunctiva/sclera: Conjunctivae  normal.     Pupils: Pupils are equal, round, and reactive to light.     Comments: Discs sharp  Neck:     Thyroid: No thyroid mass or thyromegaly.   Cardiovascular:     Rate and Rhythm: Normal rate and regular rhythm.     Heart sounds: S1 normal and S2 normal. No murmur heard.    No friction rub. No S3 or S4 sounds.     Comments: No carotid bruits.  Carotid, radial, femoral, DP and PT pulses normal and equal.   Pulmonary:     Effort: Pulmonary effort is normal.     Breath sounds: Normal breath sounds and air entry.  Chest:  Breasts:    Right: No inverted nipple, mass or nipple discharge.     Left: No inverted nipple, mass or nipple discharge.  Abdominal:     Palpations: There is no hepatomegaly, splenomegaly or mass.     Tenderness: There is no abdominal tenderness.     Hernia: No hernia is present.  Genitourinary:    General: Normal vulva.     Comments: No uterine or adnexal mass or tenderness  Musculoskeletal:        General: Normal range of motion.     Cervical back: Normal range of motion and neck supple.     Right lower leg: No edema.     Left lower leg: No edema.  Lymphadenopathy:     Head:     Right side of head: No submental or submandibular adenopathy.     Left side of head: No submental or submandibular adenopathy.     Cervical: No cervical adenopathy.     Upper Body:     Right upper body: No supraclavicular or axillary adenopathy.     Left upper body: No supraclavicular or axillary adenopathy.     Lower Body: No right inguinal adenopathy. No left inguinal adenopathy.   Skin:    General: Skin is warm.     Capillary Refill: Capillary refill takes less than 2 seconds.     Findings: Erythema (Facial --cheeks, chin, forehead with prominence of superficial small vessles.  Rough 1 cm erythematous patch at left upper cheek.  Light brown ovoid  lesion, well circumbscribed on left upper eyelid.) and rash (Splotchy round salmon colored lesions under right breast and left medial chest.) present.   Neurological:     General: No focal deficit present.     Mental Status: She is alert and oriented to person, place, and time.     Cranial Nerves: Cranial nerves 2-12 are intact.     Sensory: Sensation is intact.     Motor: Motor function is intact.     Coordination: Coordination is intact.     Gait: Gait is intact.     Deep Tendon Reflexes: Reflexes are normal and symmetric.   Psychiatric:        Mood and Affect: Mood normal.        Speech: Speech normal.        Behavior: Behavior normal. Behavior is cooperative.      Assessment & Plan   CPE without pap Mammogram FIT to return in 2 weeks. Declined COVID vaccination Fasting labs:  FLP, CBC, CMP, TSH  2.  Obesity:  encouraged making goals.  Joining Together [employer]. Increase physical activity.  TSH, FLP  3.  Perimenopause:  supportive care.  Urine HCG negative.  4.  Skin of Face:  Actinic keratosis, left maxillary area.  Seborrheic keratosis, left  upper eyelid.  Acne Rosacea.  Return to freeze actinic keratosis.  Treat Acne rosacea with doxycycline 100 mg for 1 week twice daily, then 1 tab daily for rest of a month.  Metronidazole gel topically thereafter.   Follow benign eyelid lesion--may need to refer for treatment.  5.  Tinea versicolor:  Nizoral shampoo 3 times weekly--suds up on areas of lesions and wash off after 10 minutes.

## 2023-09-08 LAB — COMPREHENSIVE METABOLIC PANEL WITH GFR
ALT: 51 IU/L — ABNORMAL HIGH (ref 0–32)
AST: 39 IU/L (ref 0–40)
Albumin: 4.5 g/dL (ref 3.9–4.9)
Alkaline Phosphatase: 82 IU/L (ref 44–121)
BUN/Creatinine Ratio: 14 (ref 9–23)
BUN: 10 mg/dL (ref 6–24)
Bilirubin Total: 0.3 mg/dL (ref 0.0–1.2)
CO2: 19 mmol/L — ABNORMAL LOW (ref 20–29)
Calcium: 9 mg/dL (ref 8.7–10.2)
Chloride: 105 mmol/L (ref 96–106)
Creatinine, Ser: 0.72 mg/dL (ref 0.57–1.00)
Globulin, Total: 2.7 g/dL (ref 1.5–4.5)
Glucose: 87 mg/dL (ref 70–99)
Potassium: 4.3 mmol/L (ref 3.5–5.2)
Sodium: 139 mmol/L (ref 134–144)
Total Protein: 7.2 g/dL (ref 6.0–8.5)
eGFR: 104 mL/min/{1.73_m2} (ref >59)

## 2023-09-08 LAB — CBC WITH DIFFERENTIAL/PLATELET
Basophils Absolute: 0.1 10*3/uL (ref 0.0–0.2)
Basos: 1 %
EOS (ABSOLUTE): 0.3 10*3/uL (ref 0.0–0.4)
Eos: 4 %
Hematocrit: 43.1 % (ref 34.0–46.6)
Hemoglobin: 14.1 g/dL (ref 11.1–15.9)
Immature Grans (Abs): 0 10*3/uL (ref 0.0–0.1)
Immature Granulocytes: 0 %
Lymphocytes Absolute: 2.1 10*3/uL (ref 0.7–3.1)
Lymphs: 30 %
MCH: 30.8 pg (ref 26.6–33.0)
MCHC: 32.7 g/dL (ref 31.5–35.7)
MCV: 94 fL (ref 79–97)
Monocytes Absolute: 0.4 10*3/uL (ref 0.1–0.9)
Monocytes: 6 %
Neutrophils Absolute: 4.2 10*3/uL (ref 1.4–7.0)
Neutrophils: 59 %
Platelets: 359 10*3/uL (ref 150–450)
RBC: 4.58 x10E6/uL (ref 3.77–5.28)
RDW: 12.8 % (ref 11.7–15.4)
WBC: 7 10*3/uL (ref 3.4–10.8)

## 2023-09-08 LAB — TSH: TSH: 1.6 u[IU]/mL (ref 0.450–4.500)

## 2023-09-08 LAB — VITAMIN D 25 HYDROXY (VIT D DEFICIENCY, FRACTURES): Vit D, 25-Hydroxy: 30.6 ng/mL (ref 30.0–100.0)

## 2023-09-12 ENCOUNTER — Telehealth: Payer: Self-pay

## 2023-09-12 NOTE — Telephone Encounter (Signed)
 [name] with [facility] reports Telephoned patient using interpreter, Amber Nixon.  Patient declined mammogram, going out of the [place].

## 2023-09-12 NOTE — Telephone Encounter (Signed)
 Telephoned patient at mobile number, using interpreter, Amber Nixon. Patient declined mammogram scholarship services. Patient is leaving the country. [facility]

## 2023-09-26 ENCOUNTER — Telehealth: Payer: Self-pay | Admitting: Internal Medicine

## 2023-09-26 NOTE — Telephone Encounter (Signed)
 Patient called to ask about lab results

## 2023-10-16 ENCOUNTER — Ambulatory Visit: Payer: Self-pay | Admitting: Internal Medicine

## 2023-10-16 ENCOUNTER — Encounter: Payer: Self-pay | Admitting: Internal Medicine

## 2023-10-20 ENCOUNTER — Telehealth: Payer: Self-pay | Admitting: Internal Medicine

## 2023-10-20 NOTE — Telephone Encounter (Signed)
 Patient states she was prescribed treatment for rosacea at her last appointment patient states she finished treatment , and symptoms are better but problem did not completely resolved. Patient  would like to know if can get more medicationr or if she needs an appointment,   Patient would also like a dental referral.

## 2023-10-24 ENCOUNTER — Telehealth: Payer: Self-pay | Admitting: Internal Medicine

## 2023-10-24 NOTE — Telephone Encounter (Signed)
 Have her put ice packs or cold compresses on the foot for 20 minutes every 2 hours and elevated foot as much as possible.   Benadryl  25 mg every 6 hours. To stay cool or will itch more. Call if no improvement or if worsens

## 2023-10-24 NOTE — Telephone Encounter (Signed)
 See other note as well before calling:  is she using the Metronidazole  gel twice daily to face?  The oral antibiotics are to be used only temporarily

## 2023-10-24 NOTE — Telephone Encounter (Signed)
 Patient need an appointment for   Patient states she felt like she was bit by a bug in her foot  on Saturday afternoon while she was out side in her yard,   Bite started like a small grain that looked like a mosquito bite and now it expanded and seems like has more rash , rash do look like blisters and some have burst out liquid is clear. Rash together seems like 3 big grains and they are hard ,patient states grains look  red around and they itch a lot.  Patient states she also has rash in her mouth outside of her lips that also started Saturday,  rash only looks red like swollen does not have liquid and itches a lot. Patient states she has not use medication, patient has only taking pills for allergy.

## 2023-10-25 NOTE — Telephone Encounter (Signed)
 Patient states she is using Metronidazole  gel but has been using gel only once a day. Patient states she will use it twice daily, and if problem continues she will call the office back.

## 2023-10-25 NOTE — Telephone Encounter (Signed)
 Called patient and notify her of message.  And asked patient to call back if symptoms do not improve or if worsens.

## 2023-10-30 NOTE — Telephone Encounter (Signed)
 Patient was called on 10/20/23, patient verbally notified of lab results

## 2023-11-15 ENCOUNTER — Other Ambulatory Visit: Payer: Self-pay | Admitting: Internal Medicine

## 2023-12-07 ENCOUNTER — Ambulatory Visit: Payer: Self-pay | Admitting: Internal Medicine

## 2023-12-07 VITALS — BP 110/76 | HR 80 | Resp 18 | Ht 60.0 in | Wt 174.0 lb

## 2023-12-07 DIAGNOSIS — L570 Actinic keratosis: Secondary | ICD-10-CM

## 2023-12-07 NOTE — Progress Notes (Unsigned)
 Here for liquid nitrogen treatment of 3 small areas of actinic keratoses

## 2023-12-07 NOTE — Patient Instructions (Signed)
Habla clinica si tiene problema

## 2024-01-04 ENCOUNTER — Telehealth: Payer: Self-pay | Admitting: Internal Medicine

## 2024-01-04 NOTE — Telephone Encounter (Signed)
 Patient would like an appointment for menstrual cycle concerns.  Patient states she is concern because she noticed ever since July her menstrual cycle has been what she considers uncontrolled,   Patient states for last month she started her cycle on September 20 and has been having it for the past 18 days.  Patient states period seems like is going away and then she starts on it again. Patient describes she had heavy period yesterday but today is just spotting and this has been for past 18 days.

## 2024-01-08 ENCOUNTER — Ambulatory Visit: Payer: Self-pay | Admitting: Internal Medicine

## 2024-01-08 VITALS — BP 122/88 | HR 68 | Resp 18 | Ht 60.0 in | Wt 172.0 lb

## 2024-01-08 DIAGNOSIS — L719 Rosacea, unspecified: Secondary | ICD-10-CM

## 2024-01-08 DIAGNOSIS — N921 Excessive and frequent menstruation with irregular cycle: Secondary | ICD-10-CM | POA: Insufficient documentation

## 2024-01-08 DIAGNOSIS — Z23 Encounter for immunization: Secondary | ICD-10-CM

## 2024-01-08 MED ORDER — MEDROXYPROGESTERONE ACETATE 10 MG PO TABS: ORAL_TABLET | ORAL | 0 refills | Status: AC

## 2024-01-08 NOTE — Telephone Encounter (Signed)
 Patient has been scheduled

## 2024-01-08 NOTE — Progress Notes (Unsigned)
 Agree with Dr. Telford evaluation and plan  Amber Nixon interprets   Abnormal Uterine Bleeding:  10 days of medroxyprogesterone.  Patient will call progress.  Has normal pelvic exam in June.  If bleeding continues, will obtain pelvic US  and consider other hormonal options.    2.  Acne Rosacea:  She will call in 2 weeks to let us  know if still with acne lesions on face and under chin and will call in 2 weeks of Doxycycline .

## 2024-01-08 NOTE — Progress Notes (Unsigned)
 Patient ID: Amber Nixon [facility], female   DOB: [birth year], 48 y.o.   MRN: [id number] I, MD in training, did initial H/P and A/P on pt, then presented to Dr.Mulberry who then evaluated pt herself and did final A/P, orders, pt counciling and education and discharge instructions (written and verbal). Pt conveyed understanding.   Interpretor: Erminio Bloomer  CC- menstrual problems  HPI/ROS- Was having regular menses (occurred every 28 days for 3 days) until July 2025, (about 3.5 months ago), when she turned 48.  After this time, she had 2 months of amenorrhea and hot flashes/ feeling sweaty once a week but OK sleep and no night sweats.  She has never had this before. On Sept 20 th, 2025,  this menstrual cycle started and it has not stopped; it has lasted 23 days now.  Bleeding is every other day.   Pt thinks she is going through menopause but worried if it  might be something else. Quantity of bleeding in the past 23 days: Varies. Soaked 1 pad/day 4 days out of the 23 and the rest of the time minimal spotting.  The day she called for appt, 5 days ago, the bleeding was a lot at 3 am when she went to the toilet and noted it was bright red blood with clots, dime sized.  Today it is a little bit. Now I am just staining and the blood is brownish.  -Had lower abdominal pain  like menstrual cramps that day she had the heavy bleeding in the toilet but none since.  No n/v/ morning sickness. -No dizziness  or orthostasis.  Feeling weak from legs and tired for past 23 days..    -Also some  headaches lasting 1-2 hours. Has had 3 episodes in the past 23 days. Top of the head B.  Headache worse when moving around. Intensity 3/10.  Does not take anything for the HA and is not bothered by it. -Sexually active. G4P4.  No Miscarriage. No contraceptive.  -A little Dyspareunia since age 27 after last daughter.  Not worsening.  It has become normal to me.  Another doctor told her that her bladder is down.    Urinates on the hour since age 48.  Does not want referral or further evaluation of that just yet.    PMH- Reviewed on EPIC; Significant for  Right ovarian cyst, obesity 30-35 BMI , no blood clots.  No hospitalization in past 6 months.   June 2025 pelvic exam was normal per Dr. Adella. PSH- none listed. No surgeries in past 6 months No Known Allergies  Meds- Reviewed on EPIC.  Last BCP 2008.   Can tolerate it OK. T-shaped IUD was used; could not tolerate. Pills was preferred.  Also has had BC shots. Fx- When mother stopped menses? Do not know. Sisters are younger than her.  One sister with menstrual problems since age 47.  No blood clots in the family. So hx- no tobacco use.  ROS-  Skin- had nitrogen therapy for actinic  keratosis a month ago with Dr. Adella- doing ok. No problems. -acne rosacea- Metrogel  . Sx's still on her left cheek.   Pex-  112/88  68 Gen- appears well. NAD, ambulating. HEENT- conjunctivae without pallor. Resp- Nl effort Pelvic- Pt declined pelvic saying it is after work and she does not feel clean and feels embarrassed Skin- the left cheek under lateral corner of left eye-  s/p nitrogen treatment of actinic keratosis- well-healed; pt expressed satisfaction during exam.  3-4 papules on left cheek and  1 on neck  Studies: POCT- Upreg- neg Labs -CBC sent for fatigue/weakness associated with the vaginal bleeding. Review of Labs from 6/25- normal TSH  A/P 1- Acute Metrorrhagia with irregular cycles.- Most likely start of peri-menopause- Hemodynamically stable.  Sx's mild.  + fatigue/weakness. -Miscarriage and ectopic ruled out.  Active bleed ruled out by history.  Pelvic deferred per pt.  Medically OK not to get a pelvic right now. -Discussed no therapy and observation vs 10 days of progesterone - Initially chose observation but with further discussion, pt chose the progesterone.    - pt to call clinic if bleeding does not stop after progesterone course is  complete.  - Get U/S if bleeding does not stop after progesterone therapy.     - Call sooner if sx's worse. - If periods continue to be irregular and if they bother pt after this acute event,  pt told that we can discuss chronic progesterone therapy at that time.  -Pt conveyed understanding.   2- Acne Rosacea- still not resolved. Pt will call in 2 weeks if sxs are still there. -Will start Doxy for 2 weeks if not improved. - pt advised she would need birth control with use of doxycyline as it can affect the baby.   3- Health Maintenance- Flu shot today.  Declines Covid which gives her side effects such as fever, body aches.

## 2024-01-09 LAB — CBC WITH DIFFERENTIAL/PLATELET
Basophils Absolute: 0 x10E3/uL (ref 0.0–0.2)
Basos: 0 %
EOS (ABSOLUTE): 0.4 x10E3/uL (ref 0.0–0.4)
Eos: 4 %
Hematocrit: 41.3 % (ref 34.0–46.6)
Hemoglobin: 13.6 g/dL (ref 11.1–15.9)
Immature Grans (Abs): 0 x10E3/uL (ref 0.0–0.1)
Immature Granulocytes: 0 %
Lymphocytes Absolute: 2.9 x10E3/uL (ref 0.7–3.1)
Lymphs: 32 %
MCH: 30.4 pg (ref 26.6–33.0)
MCHC: 32.9 g/dL (ref 31.5–35.7)
MCV: 92 fL (ref 79–97)
Monocytes Absolute: 0.5 x10E3/uL (ref 0.1–0.9)
Monocytes: 5 %
Neutrophils Absolute: 5.2 x10E3/uL (ref 1.4–7.0)
Neutrophils: 59 %
Platelets: 367 x10E3/uL (ref 150–450)
RBC: 4.47 x10E6/uL (ref 3.77–5.28)
RDW: 12.2 % (ref 11.7–15.4)
WBC: 9 x10E3/uL (ref 3.4–10.8)

## 2024-01-22 ENCOUNTER — Telehealth: Payer: Self-pay | Admitting: Internal Medicine

## 2024-01-22 NOTE — Telephone Encounter (Signed)
 Patient called and state patient was asked to call after completing medication medroxyPROGESTERone (PROVERA) 10 MG tablet   Patient was asked to call if symptoms continue.  Patient states she finished medication last Thursday . Patient states she was fine without bleeding that Thursday, Friday , Saturday and Sunday, but today patient states she started to bleed again, patient states it is not heavy bleeding she just noticed she is staining toilet paper when cleaning.   Patient states she is having symptoms like menstrual cycle, back pain and stomach pain.

## 2024-02-13 NOTE — Telephone Encounter (Signed)
 Spoke with patient today , asked if the bleeding ultimately stopped on its own?  Patient states yes, bleeding stopped on its own and is now feeling better.

## 2024-03-04 ENCOUNTER — Ambulatory Visit: Payer: Self-pay | Admitting: Internal Medicine

## 2024-03-04 NOTE — Progress Notes (Signed)
 I called the patient and made sure that she understands me and then I notified her.

## 2024-04-26 ENCOUNTER — Other Ambulatory Visit: Payer: Self-pay

## 2024-05-23 ENCOUNTER — Other Ambulatory Visit: Payer: Self-pay

## 2024-09-06 ENCOUNTER — Other Ambulatory Visit: Payer: Self-pay

## 2024-09-10 ENCOUNTER — Encounter: Payer: Self-pay | Admitting: Internal Medicine
